# Patient Record
Sex: Female | Born: 1980
Health system: Southern US, Community
[De-identification: ages and names within clinical notes are randomized; demographics above are authoritative.]

## PROBLEM LIST (undated history)

## (undated) DIAGNOSIS — F32A Depression, unspecified: Secondary | ICD-10-CM

## (undated) DIAGNOSIS — B977 Papillomavirus as the cause of diseases classified elsewhere: Secondary | ICD-10-CM

## (undated) DIAGNOSIS — E041 Nontoxic single thyroid nodule: Secondary | ICD-10-CM

## (undated) DIAGNOSIS — A64 Unspecified sexually transmitted disease: Secondary | ICD-10-CM

## (undated) DIAGNOSIS — F419 Anxiety disorder, unspecified: Secondary | ICD-10-CM

## (undated) HISTORY — DX: Depression, unspecified: F32.A

## (undated) HISTORY — DX: Unspecified sexually transmitted disease: A64

## (undated) HISTORY — PX: OTHER SURGICAL HISTORY: SHX169

## (undated) HISTORY — DX: Papillomavirus as the cause of diseases classified elsewhere: B97.7

## (undated) HISTORY — DX: Anxiety disorder, unspecified: F41.9

---

## 2004-08-12 DIAGNOSIS — B977 Papillomavirus as the cause of diseases classified elsewhere: Secondary | ICD-10-CM

## 2004-08-12 HISTORY — DX: Papillomavirus as the cause of diseases classified elsewhere: B97.7

## 2005-08-12 HISTORY — PX: WISDOM TOOTH EXTRACTION: SHX21

## 2008-08-12 DIAGNOSIS — A64 Unspecified sexually transmitted disease: Secondary | ICD-10-CM

## 2008-08-12 HISTORY — DX: Unspecified sexually transmitted disease: A64

## 2012-04-07 LAB — HM PAP SMEAR

## 2012-05-13 ENCOUNTER — Ambulatory Visit (INDEPENDENT_AMBULATORY_CARE_PROVIDER_SITE_OTHER): Payer: PRIVATE HEALTH INSURANCE | Admitting: Internal Medicine

## 2012-05-13 ENCOUNTER — Encounter: Payer: Self-pay | Admitting: Internal Medicine

## 2012-05-13 VITALS — BP 94/68 | HR 71 | Temp 99.3°F | Ht 60.0 in | Wt 156.0 lb

## 2012-05-13 DIAGNOSIS — Z0000 Encounter for general adult medical examination without abnormal findings: Secondary | ICD-10-CM | POA: Insufficient documentation

## 2012-05-13 DIAGNOSIS — J069 Acute upper respiratory infection, unspecified: Secondary | ICD-10-CM | POA: Insufficient documentation

## 2012-05-13 NOTE — Assessment & Plan Note (Signed)
31 year old female with signs and symptoms of viral upper respiratory infection. She has chronic persistent cough. I suggest she use intranasal saline continue over-the-counter Mucinex. Patient advised to contact our office if she has  worsening symptoms.

## 2012-05-13 NOTE — Patient Instructions (Addendum)
Use intranasal saline spray 3-4 times per day Gargle with warm salt water

## 2012-05-13 NOTE — Progress Notes (Signed)
Subjective:    Patient ID: Robin Burke, female    DOB: Mar 10, 1981, 31 y.o.   MRN: [id number]  HPI  31 year old female to establish. Patient complains of nonproductive cough for 2 weeks. Her symptoms initially started with mild sore throat and then progressed to cough. Patient reports cough is worse in the morning. She has tried several herbal remedies without any improvement. She denies any fever or chills. She denies any history of asthma. She has remote history of mild tobacco use.  Approximately one week ago she was seen by nurse practitioner at her employer. She has been using Mucinex antihistamines with mild improvement. She denies any sinus pressure or pain.  Review of Systems   Constitutional: Negative for activity change, appetite change and unexpected weight change.  Eyes: Negative for visual disturbance.  Respiratory: Negative chest tightness and shortness of breath.   Cardiovascular: Negative for chest pain.  Genitourinary: Negative for difficulty urinating.  Neurological: Negative for headaches.  Gastrointestinal: Negative for abdominal pain, heartburn melena or hematochezia Psych: Negative for depression or anxiety GYN:  She is followed by local gynecologist for Pap and pelvic exam  History reviewed. No pertinent past medical history.  History   Social History  . Marital Status: Single    Spouse Name: N/A    Number of Children: N/A  . Years of Education: N/A   Occupational History  . 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 Smoking status: Not on file  . Smokeless tobacco: Not on file  . Alcohol Use: 1.8 oz/week    3 Glasses of wine per week  . Drug Use:   . Sexually Active:    Other Topics Concern  . Not on file   Social History Narrative   She moved to [place] one half years ago. She previously lived in [place] and in [place].She is half [place] and half [place].She works for [employer]    History reviewed. No  pertinent past surgical history.  Family History  Problem Relation Age of Onset  . Diabetes Mother   . Hyperlipidemia Mother   . Coronary artery disease Father     Had 1 stent placed  . Thyroid cancer Maternal Grandmother   . Osteoarthritis Maternal Grandmother     No Known Allergies  Current Outpatient Prescriptions on File Prior to Visit  Medication Sig Dispense Refill  . fexofenadine (ALLEGRA) 180 MG tablet Take 180 mg by mouth daily.        BP 94/68  Pulse 71  Temp 99.3 F (37.4 C) (Oral)  Ht 5' (1.524 m)  Wt 156 lb (70.761 kg)  BMI 30.47 kg/m2  SpO2 99%  LMP 04/15/2012           Objective:   Physical Exam  Constitutional: She is oriented to person, place, and time. She appears well-developed and well-nourished. No distress.  HENT:  Head: Normocephalic and atraumatic.  Right Ear: External ear normal.  Left Ear: External ear normal.  Mouth/Throat: No oropharyngeal exudate.       Signs of postnasal drip  Eyes: EOM are normal. Pupils are equal, round, and reactive to light.  Neck: Neck supple.       No carotid bruit  Cardiovascular: Normal rate, regular rhythm, normal heart sounds and intact distal pulses.   No murmur heard. Pulmonary/Chest: Effort normal and breath sounds normal. She has no wheezes. She has no rales.  Abdominal: Bowel sounds are normal. She exhibits no mass. There  is no tenderness.  Musculoskeletal: She exhibits no edema.  Lymphadenopathy:    She has no cervical adenopathy.  Neurological: She is alert and oriented to person, place, and time. No cranial nerve deficit. She exhibits normal muscle tone. Coordination normal.  Skin: Skin is warm and dry.  Psychiatric: She has a normal mood and affect. Her behavior is normal.          Assessment & Plan:

## 2012-05-13 NOTE — Assessment & Plan Note (Signed)
Patient reports she received her tetanus vaccine in 2007 - 2008. She is followed by local gynecologist for annual Pap and pelvic exams. Patient will check status of her hepatitis A vaccine as she frequently travels internationally.

## 2013-04-30 ENCOUNTER — Encounter: Payer: Self-pay | Admitting: Nurse Practitioner

## 2013-05-03 ENCOUNTER — Ambulatory Visit: Payer: Self-pay | Admitting: Nurse Practitioner

## 2013-05-04 ENCOUNTER — Ambulatory Visit (INDEPENDENT_AMBULATORY_CARE_PROVIDER_SITE_OTHER): Payer: PRIVATE HEALTH INSURANCE | Admitting: Nurse Practitioner

## 2013-05-04 ENCOUNTER — Encounter: Payer: Self-pay | Admitting: Nurse Practitioner

## 2013-05-04 VITALS — BP 118/74 | HR 64 | Resp 16 | Ht 61.0 in | Wt 163.0 lb

## 2013-05-04 DIAGNOSIS — Z01419 Encounter for gynecological examination (general) (routine) without abnormal findings: Secondary | ICD-10-CM

## 2013-05-04 DIAGNOSIS — Z0000 Encounter for general adult medical examination without abnormal findings: Secondary | ICD-10-CM

## 2013-05-04 LAB — HEMOGLOBIN, FINGERSTICK: Hemoglobin, fingerstick: 14.1 g/dL (ref 12.0–16.0)

## 2013-05-04 NOTE — Patient Instructions (Signed)

## 2013-05-04 NOTE — Progress Notes (Signed)
Patient ID: Robin Burke, female   DOB: 04-11-1981, 32 y.o.   MRN: [id number] 32 y.o. G0P0 Married mixed racial Fe here for annual exam.  Menses is regular and last 5 days. Not planning pregnancy this year. Chronic constipation has now cleared. Change of diet and increase of water has helped.  Also found out she has gluten allergy.  Husband lives in the [place] and his Holiday representative job is expected to end next summer and then plans on moving to the states.  She gets to see him about 1-2 times yearly.  Patient's last menstrual period was 04/29/2013.          Sexually active: no  The current method of family planning is none. Condoms prn.   Exercising: yes  cardio and yoga Smoker:  no  Health Maintenance: Pap:  04/07/12, WNL TDaP:  2006  Labs: HB: 14.1 Urine: unable to void   reports that she has quit smoking. Her smoking use included Cigarettes. She smoked 0.10 packs per day. She has never used smokeless tobacco. She reports that she drinks about 1.2 ounces of alcohol per week. She reports that she does not use illicit drugs.  Past Medical History  Diagnosis Date  . STD (female)     HSV I & II  . HPV in female 2006    w/cryo, normal since    No past surgical history on file.  Current Outpatient Prescriptions  Medication Sig Dispense Refill  . Multiple Vitamin (MULTIVITAMIN) tablet Take 1 tablet by mouth daily.       No current facility-administered medications for this visit.    Family History  Problem Relation Age of Onset  . Diabetes Mother   . Hyperlipidemia Mother   . Coronary artery disease Father     Had 1 stent placed  . Thyroid cancer Maternal Grandmother   . Osteoarthritis Maternal Grandmother     ROS:  Pertinent items are noted in HPI.  Otherwise, a comprehensive ROS was negative.  Exam:   BP 118/74  Pulse 64  Resp 16  Ht 5\' 1"  (1.549 m)  Wt 163 lb (73.936 kg)  BMI 30.81 kg/m2  LMP 04/29/2013 Height: 5\' 1"  (154.9 cm)  Ht Readings from Last 3  Encounters:  05/04/13 5\' 1"  (1.549 m)  05/13/12 5' (1.524 m)    General appearance: alert, cooperative and appears stated age Head: Normocephalic, without obvious abnormality, atraumatic Neck: no adenopathy, supple, symmetrical, trachea midline and thyroid normal to inspection and palpation Lungs: clear to auscultation bilaterally Breasts: normal appearance, no masses or tenderness Heart: regular rate and rhythm Abdomen: soft, non-tender; no masses,  no organomegaly Extremities: extremities normal, atraumatic, no cyanosis or edema Skin: Skin color, texture, turgor normal. No rashes or lesions Lymph nodes: Cervical, supraclavicular, and axillary nodes normal. No abnormal inguinal nodes palpated Neurologic: Grossly normal   Pelvic: External genitalia:  no lesions              Urethra:  normal appearing urethra with no masses, tenderness or lesions              Bartholin's and Skene's: normal                 Vagina: normal appearing vagina with normal color and discharge, no lesions              Cervix: anteverted              Pap taken: yes Bimanual Exam:  Uterus:  normal size,  contour, position, consistency, mobility, non-tender              Adnexa: no mass, fullness, tenderness               Rectovaginal: Confirms               Anus:  normal sphincter tone, no lesions  A:  Well Woman with normal exam  Condoms for birth control    P:   Pap smear as per guidelines pap done today  Per request wanted blood type  Counseled on breast self exam, adequate intake of calcium and vitamin D, diet and exercise return annually or prn  An After Visit Summary was printed and given to the patient.

## 2013-05-06 LAB — IPS PAP TEST WITH HPV

## 2013-05-10 ENCOUNTER — Telehealth: Payer: Self-pay | Admitting: Nurse Practitioner

## 2013-05-10 NOTE — Telephone Encounter (Signed)
Results given via phone message:  Result Notes    Notes Recorded by Luisa Dago, CMA on 05/10/2013 at 4:11 PM Pt is going to think about coming back. She will have to ask for time off. ------  Notes Recorded by Luisa Dago, CMA on 05/10/2013 at 4:01 PM I have attempted to contact this patient by phone with the following results: left message to return my call on answering machine (home/mobile). ------  Notes Recorded by Lauro Franklin, FNP on 05/07/2013 at 11:08 AM Pap 02, did she decide to come back for blood typing?

## 2013-05-10 NOTE — Telephone Encounter (Signed)
Pt calling for results

## 2013-05-10 NOTE — Progress Notes (Signed)
Encounter reviewed by Dr. Elyshia Kumagai Silva.  

## 2013-05-27 ENCOUNTER — Telehealth: Payer: Self-pay | Admitting: Nurse Practitioner

## 2013-05-27 NOTE — Telephone Encounter (Signed)
Patient calling to reschedule labs that were missed at last visit but first wants to discuss what the cost of the lab may be with [place] in insurance.

## 2013-05-27 NOTE — Telephone Encounter (Signed)
Patient is requesting blood type testing.  Will need lab appointment if she requests.  Will wait for Shanda Bumps to call patient to discuss cost.

## 2013-05-28 ENCOUNTER — Telehealth: Payer: Self-pay | Admitting: Nurse Practitioner

## 2013-05-28 NOTE — Telephone Encounter (Signed)
Patient calling you back about her insurance covering a procedure [phone] blood type test. She wants to know how much does it cost if she didn't have insurance.

## 2013-12-31 ENCOUNTER — Ambulatory Visit (INDEPENDENT_AMBULATORY_CARE_PROVIDER_SITE_OTHER): Payer: PRIVATE HEALTH INSURANCE | Admitting: Internal Medicine

## 2013-12-31 ENCOUNTER — Encounter: Payer: Self-pay | Admitting: Internal Medicine

## 2013-12-31 VITALS — BP 94/66 | HR 80 | Temp 98.5°F | Ht 61.0 in | Wt 165.0 lb

## 2013-12-31 DIAGNOSIS — R5383 Other fatigue: Principal | ICD-10-CM | POA: Insufficient documentation

## 2013-12-31 DIAGNOSIS — R5381 Other malaise: Secondary | ICD-10-CM

## 2013-12-31 DIAGNOSIS — E041 Nontoxic single thyroid nodule: Secondary | ICD-10-CM | POA: Insufficient documentation

## 2013-12-31 DIAGNOSIS — R5382 Chronic fatigue, unspecified: Secondary | ICD-10-CM | POA: Insufficient documentation

## 2013-12-31 LAB — BASIC METABOLIC PANEL
BUN: 12 mg/dL (ref 6–23)
CO2: 26 meq/L (ref 19–32)
Calcium: 9 mg/dL (ref 8.4–10.5)
Chloride: 103 mEq/L (ref 96–112)
Creatinine, Ser: 0.6 mg/dL (ref 0.4–1.2)
GFR: 116.47 mL/min (ref >60.00)
GLUCOSE: 88 mg/dL (ref 70–99)
POTASSIUM: 4.1 meq/L (ref 3.5–5.1)
SODIUM: 136 meq/L (ref 135–145)

## 2013-12-31 LAB — CBC WITH DIFFERENTIAL/PLATELET
BASOS ABS: 0 10*3/uL (ref 0.0–0.1)
Basophils Relative: 0.4 % (ref 0.0–3.0)
EOS ABS: 0.2 10*3/uL (ref 0.0–0.7)
Eosinophils Relative: 3.1 % (ref 0.0–5.0)
HEMATOCRIT: 41.5 % (ref 36.0–46.0)
HEMOGLOBIN: 13.5 g/dL (ref 12.0–15.0)
LYMPHS ABS: 1.3 10*3/uL (ref 0.7–4.0)
Lymphocytes Relative: 26.7 % (ref 12.0–46.0)
MCHC: 32.5 g/dL (ref 30.0–36.0)
MCV: 78.5 fl (ref 78.0–100.0)
MONO ABS: 0.5 10*3/uL (ref 0.1–1.0)
Monocytes Relative: 9.7 % (ref 3.0–12.0)
NEUTROS ABS: 3 10*3/uL (ref 1.4–7.7)
Neutrophils Relative %: 60.1 % (ref 43.0–77.0)
Platelets: 199 10*3/uL (ref 150.0–400.0)
RBC: 5.29 Mil/uL — ABNORMAL HIGH (ref 3.87–5.11)
RDW: 14.2 % (ref 11.5–15.5)
WBC: 5 10*3/uL (ref 4.0–10.5)

## 2013-12-31 LAB — TSH: TSH: 1.03 u[IU]/mL (ref 0.35–4.50)

## 2013-12-31 LAB — HEPATIC FUNCTION PANEL
ALBUMIN: 4.1 g/dL (ref 3.5–5.2)
ALT: 25 U/L (ref 0–35)
AST: 21 U/L (ref 0–37)
Alkaline Phosphatase: 57 U/L (ref 39–117)
Bilirubin, Direct: 0 mg/dL (ref 0.0–0.3)
Total Bilirubin: 0.7 mg/dL (ref 0.2–1.2)
Total Protein: 6.9 g/dL (ref 6.0–8.3)

## 2013-12-31 LAB — T3, FREE: T3, Free: 2.9 pg/mL (ref 2.3–4.2)

## 2013-12-31 LAB — T4, FREE: Free T4: 0.92 ng/dL (ref 0.60–1.60)

## 2013-12-31 LAB — LIPID PANEL
CHOLESTEROL: 170 mg/dL (ref 0–200)
HDL: 49.8 mg/dL (ref >39.00)
LDL Cholesterol: 91 mg/dL (ref 0–99)
TRIGLYCERIDES: 146 mg/dL (ref 0.0–149.0)
Total CHOL/HDL Ratio: 3
VLDL: 29.2 mg/dL (ref 0.0–40.0)

## 2013-12-31 NOTE — Progress Notes (Signed)
   Subjective:    Patient ID: Robin Burke, female    DOB: September 10, 1980, 33 y.o.   MRN: [id number]  HPI  33 year old female complains of progressive fatigue over last few months. She reports gaining approximately 10 pounds and never feeling she gets enough sleep. She also has intermittent headaches.  She is worried about possible thyroid disorder. Her maternal grandmother has history of thyroid cancer.  She does admit to increased stress that is work related. 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  She often experiences poor sleep.   Review of Systems 10 pound weight gain, negative for alopecia, negative constipation, negative cold intolerance    Past Medical History  Diagnosis Date  . STD (female)     HSV I & II  . HPV in female 2006    w/cryo, normal since    History   Social History  . Marital Status: Married    Spouse Name: N/A    Number of Children: 0  . Years of Education: N/A   Occupational History  . 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 Smoking status: Former Smoker -- 0.25 packs/day for 10 years    Types: Cigarettes    Quit date: 09/13/2011  . Smokeless tobacco: Never Used  . Alcohol Use: 1.2 - 1.8 oz/week    2-3 Glasses of wine per week  . Drug Use: No  . Sexual Activity: Yes    Partners: Male   Other Topics Concern  . Not on file   Social History Narrative   She moved to [place] one half years ago. She previously lived in [place] and in [place].   She is half [place] and half [place].   She works for [employer]    Past Surgical History  Procedure Laterality Date  . Wisdom tooth extraction  2007    Family History  Problem Relation Age of Onset  . Diabetes Mother   . Hyperlipidemia Mother   . Coronary artery disease Father     Had 1 stent placed  . Thyroid cancer Maternal Grandmother   . Osteoarthritis Maternal Grandmother   . Diabetes Brother     No  Known Allergies  Current Outpatient Prescriptions on File Prior to Visit  Medication Sig Dispense Refill  . Multiple Vitamin (MULTIVITAMIN) tablet Take 1 tablet by mouth daily.       No current facility-administered medications on file prior to visit.    BP 94/66  Pulse 80  Temp(Src) 98.5 F (36.9 C) (Oral)  Ht 5\' 1"  (1.549 m)  Wt 165 lb (74.844 kg)  BMI 31.19 kg/m2    Objective:   Physical Exam  Constitutional: She is oriented to person, place, and time. She appears well-developed and well-nourished. No distress.  HENT:  Head: Normocephalic and atraumatic.  Right Ear: External ear normal.  Left Ear: External ear normal.  Mouth/Throat: Oropharynx is clear and moist.  Neck: Neck supple.  Question right thyroid nodule  Cardiovascular: Normal rate, regular rhythm and normal heart sounds.   No murmur heard. Pulmonary/Chest: Effort normal and breath sounds normal. She has no wheezes.  Musculoskeletal: She exhibits no edema.  Neurological: She is alert and oriented to person, place, and time. No cranial nerve deficit.  Psychiatric: She has a normal mood and affect. Her behavior is normal.          Assessment & Plan:

## 2013-12-31 NOTE — Assessment & Plan Note (Signed)
33 year old female complains of worsening fatigue. She has family history of thyroid cancer. Rule out metabolic disorder. Obtain screening blood work. I suspect her fatigue secondary to her stressful work situation and frequent travel. She declines use of prescription sleep aids.

## 2013-12-31 NOTE — Progress Notes (Signed)
Pre visit review using our clinic review tool, if applicable. No additional management support is needed unless otherwise documented below in the visit note. 

## 2013-12-31 NOTE — Assessment & Plan Note (Signed)
There is question of possible right thyroid nodule on exam. She has family history of thyroid cancer. Obtain thyroid ultrasound.

## 2014-01-01 LAB — HIV ANTIBODY (ROUTINE TESTING W REFLEX): HIV 1&2 Ab, 4th Generation: NONREACTIVE

## 2014-01-06 ENCOUNTER — Ambulatory Visit
Admission: RE | Admit: 2014-01-06 | Discharge: 2014-01-06 | Disposition: A | Discharge status: Home / Self Care | Payer: PRIVATE HEALTH INSURANCE | Source: Ambulatory Visit | Attending: Internal Medicine | Admitting: Internal Medicine

## 2014-01-06 DIAGNOSIS — E041 Nontoxic single thyroid nodule: Secondary | ICD-10-CM

## 2014-01-18 ENCOUNTER — Encounter: Payer: Self-pay | Admitting: *Deleted

## 2014-01-18 ENCOUNTER — Other Ambulatory Visit: Payer: Self-pay | Admitting: Internal Medicine

## 2014-01-18 DIAGNOSIS — E041 Nontoxic single thyroid nodule: Secondary | ICD-10-CM

## 2014-01-25 ENCOUNTER — Ambulatory Visit
Admission: RE | Admit: 2014-01-25 | Discharge: 2014-01-25 | Disposition: A | Discharge status: Home / Self Care | Payer: PRIVATE HEALTH INSURANCE | Source: Ambulatory Visit | Attending: Internal Medicine | Admitting: Internal Medicine

## 2014-01-25 ENCOUNTER — Other Ambulatory Visit (HOSPITAL_COMMUNITY)
Admission: RE | Admit: 2014-01-25 | Discharge: 2014-01-25 | Disposition: A | Discharge status: Home / Self Care | Payer: PRIVATE HEALTH INSURANCE | Source: Ambulatory Visit | Attending: Interventional Radiology | Admitting: Interventional Radiology

## 2014-01-25 DIAGNOSIS — E041 Nontoxic single thyroid nodule: Secondary | ICD-10-CM | POA: Insufficient documentation

## 2014-01-28 ENCOUNTER — Encounter: Payer: Self-pay | Admitting: Internal Medicine

## 2014-01-28 ENCOUNTER — Ambulatory Visit (INDEPENDENT_AMBULATORY_CARE_PROVIDER_SITE_OTHER): Payer: PRIVATE HEALTH INSURANCE | Admitting: Internal Medicine

## 2014-01-28 ENCOUNTER — Other Ambulatory Visit: Payer: Self-pay | Admitting: Internal Medicine

## 2014-01-28 VITALS — BP 102/60 | HR 65 | Temp 98.3°F | Resp 12 | Wt 162.8 lb

## 2014-01-28 DIAGNOSIS — R5383 Other fatigue: Secondary | ICD-10-CM

## 2014-01-28 DIAGNOSIS — E041 Nontoxic single thyroid nodule: Secondary | ICD-10-CM

## 2014-01-28 DIAGNOSIS — R5381 Other malaise: Secondary | ICD-10-CM

## 2014-01-28 NOTE — Progress Notes (Signed)
Patient ID: Robin Burke, female   DOB: [birth year], 33 y.o.   MRN: [id number]   HPI  Robin Burke is a 33 y.o.-year-old female, referred by her PCP, Robin Burke, for management of a R Thyroid nodule.  Thyroid U/S (01/18/2014):   Right thyroid lobe : 3.8 x 1.9 x 1.8 cm. There is a complex nodule in the right thyroid lobe that measures 2.1 x 1.2 x 1.6 cm. This nodule is in the upper aspect of the right thyroid lobe. No definite calcifications in this nodule.   Left thyroid lobe : 3.8 x 1.3 x 1.8 cm. Small hypoechoic nodule in the inferior left thyroid lobe measures 0.6 x 0.5 x 0.6 cm. This nodule is mildly heterogeneous. There is a second small heterogeneous nodule in the inferior left thyroid lobe that measures 0.7 x 0.4 x 0.6 cm.   Isthmus Thickness: 0.5 cm. No nodules visualized.   Lymphadenopathy None visualized.  FNA of the R dominant nodule (01/25/2014): FLUS (follicular lesion of undetermined significance)  I reviewed pt's thyroid tests and they were normal: Lab Results  Component Value Date   TSH 1.03 12/31/2013   FREET4 0.92 12/31/2013    Pt denies feeling nodules in neck, hoarseness, dysphagia/odynophagia, SOB with lying down.  Pt c/o: - + exhausted, + fatigue - + poor sleep - + weight gain - used to have heat intolerance/now cold intolerance - no tremors - + palpitations lately - anxiety/depression -+ constipation - no dry skin - + hair falling - + HAs - + dysmenorrhea - lately; menses are monthly   Pt does have a FH of thyroid ds in mother (mother with MNG). + FH of thyroid cancer in MGM. No h/o radiation tx to head or neck.  She loves to eat seaweed (not recently). No recent CT scans. No steroid use. No herbal supplements.   She travels to [place] and [place] 2x a year (for work).  ROS: Constitutional: see HPI Eyes: no blurry vision, no xerophthalmia ENT: + sore throat, no nodules palpated in throat, no dysphagia/odynophagia, +  Hoarseness, +  tinnistus Cardiovascular: + CP/+ SOB/+ palpitations/+ leg swelling Respiratory: no cough/+ SOB Gastrointestinal: no N/V/D/+ C Musculoskeletal: + muscle aches, no joint aches Skin: no rashes, + hair loss Neurological: no tremors/numbness/tingling/dizziness, + HA Psychiatric: no depression/anxiety + Low libido  Past Medical History  Diagnosis Date  . STD (female)     HSV I & II  . HPV in female 2006    w/cryo, normal since   Past Surgical History  Procedure Laterality Date  . Wisdom tooth extraction  2007   History   Social History  . Marital Status: Married    Spouse Name: N/A    Number of Children: 0  . Years of Education: N/A   Occupational History  . 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 Smoking status: Former Smoker -- 0.25 packs/day for 10 years    Types: Cigarettes    Quit date: 09/13/2011  . Smokeless tobacco: Never Used  . Alcohol Use: 1.2 - 1.8 oz/week    2-3 Glasses of wine per week  . Drug Use: No  . Sexual Activity: Yes    Partners: Male   Social History Narrative   She moved to [place] one half years ago. She previously lived in [place] and in [place].   She is half [place] and half [place].   She works for [employer]   Current Outpatient Prescriptions on  File Prior to Visit  Medication Sig Dispense Refill  . Multiple Vitamin (MULTIVITAMIN) tablet Take 1 tablet by mouth daily.       No current facility-administered medications on file prior to visit.   No Known Allergies Family History  Problem Relation Age of Onset  . Diabetes Mother   . Hyperlipidemia Mother   . Coronary artery disease Father     Had 1 stent placed  . Thyroid cancer Maternal Grandmother   . Osteoarthritis Maternal Grandmother   . Diabetes Brother    PE: BP 102/60  Pulse 65  Temp(Src) 98.3 F (36.8 C) (Oral)  Resp 12  Wt 162 lb 12.8 oz (73.846 kg)  SpO2 98% Wt Readings from Last 3 Encounters:  01/28/14 162 lb 12.8 oz (73.846  kg)  12/31/13 165 lb (74.844 kg)  05/04/13 163 lb (73.936 kg)   Constitutional: overweight, in NAD Eyes: PERRLA, EOMI, no exophthalmos ENT: moist mucous membranes, + thyromegaly R>L, no cervical lymphadenopathy Cardiovascular: RRR, No MRG Respiratory: CTA B Gastrointestinal: abdomen soft, NT, ND, BS+ Musculoskeletal: no deformities, strength intact in all 4;  Skin: moist, warm, no rashes Neurological: no tremor with outstretched hands, DTR normal in all 4  ASSESSMENT: 1. MNG - dominant nodule Bx >> FLUS  2. Fatigue  PLAN: 1. MNG  - I reviewed the images of her thyroid ultrasound along with the patient. I pointed out that the dominant nodule is large, this being a risk factor for cancer. Pt does have a thyroid cancer family history and this increases her risk of cancer. She does not have a personal history of RxTx to head/neck.  - we discussed about the FLUS (Bethesda class 3) meaning and I explained that this is a gray area for a Bx: not clear if it is benign or malignant. The risk of malignancy is actually ~5-15% for this particular lesion. - since there is a higher risk of cancer, the guidelines recommend reBx in 6-12 mo. I also offer to the pt the option of referring her to surgery now for hemithyroidectomy. I did explain that, while thyroid surgery is not a complicated one, it still can have side effects and also she might have a risk of ~25% of becoming hypothyroid after hemithyroidectomy. She opted for Re-Bx in 6 mo. I advised her to call me in December so I can order it - if this is not cancer, we can continue to follow her on a yearly basis, and check another ultrasound in another year or 2. - I'll see her back in a year, assuming her FNA is normal. If FNA abnormal, we will meet sooner.  - I advised pt to join my chart and I will send her the results through there   2. Fatigue - pt requests more in depth thyroid testing due to her family history of thyroid ds and her fatigue  -  since her TSH, free T4 and free T3 were recently normal, I ordered anti-Tg and anti-TPO Abs to check for Hashimoto's thyroiditis  Component     Latest Ref Rng 01/28/2014  Thyroid Peroxidase Antibody     <35.0 IU/mL <10.0  Thyroglobulin Ab     <40.0 IU/mL <20.0  No sign of Hashimoto's thyroiditis.

## 2014-01-28 NOTE — Patient Instructions (Signed)
Please return in 1 year. Please call me in 6 months to reorder the thyroid biopsy.

## 2014-01-31 ENCOUNTER — Encounter: Payer: Self-pay | Admitting: *Deleted

## 2014-01-31 LAB — THYROGLOBULIN ANTIBODY: Thyroglobulin Ab: <20 IU/mL (ref <40.0)

## 2014-01-31 LAB — THYROID PEROXIDASE ANTIBODY

## 2014-03-02 ENCOUNTER — Ambulatory Visit: Payer: PRIVATE HEALTH INSURANCE | Admitting: Psychology

## 2014-05-03 ENCOUNTER — Telehealth: Payer: Self-pay | Admitting: Nurse Practitioner

## 2014-05-03 NOTE — Telephone Encounter (Signed)
Called patient and left message cancelling her AEX with PBerneice Gandy for 05/05/14. Patient will need to reschedule.

## 2014-05-05 ENCOUNTER — Ambulatory Visit: Payer: PRIVATE HEALTH INSURANCE | Admitting: Nurse Practitioner

## 2014-06-07 ENCOUNTER — Ambulatory Visit (INDEPENDENT_AMBULATORY_CARE_PROVIDER_SITE_OTHER): Payer: Commercial Managed Care - PPO | Admitting: Nurse Practitioner

## 2014-06-07 ENCOUNTER — Encounter: Payer: Self-pay | Admitting: Nurse Practitioner

## 2014-06-07 VITALS — BP 112/62 | HR 68 | Ht 61.0 in | Wt 157.0 lb

## 2014-06-07 DIAGNOSIS — Z0000 Encounter for general adult medical examination without abnormal findings: Secondary | ICD-10-CM | POA: Diagnosis not present

## 2014-06-07 DIAGNOSIS — R87810 Cervical high risk human papillomavirus (HPV) DNA test positive: Secondary | ICD-10-CM

## 2014-06-07 DIAGNOSIS — Z01419 Encounter for gynecological examination (general) (routine) without abnormal findings: Secondary | ICD-10-CM | POA: Diagnosis not present

## 2014-06-07 LAB — HEMOGLOBIN, FINGERSTICK: HEMOGLOBIN, FINGERSTICK: 14 g/dL (ref 12.0–16.0)

## 2014-06-07 NOTE — Progress Notes (Addendum)
Patient ID: Robin Burke, female   DOB: 01-29-1981, 33 y.o.   MRN: [id number] [age] y.o. G0P0 Married mixed race( half [place] and half [place]) Fe here for annual exam.  Menses is regular 28-30 days.  Husband lived out of the country until his return 10/21.  After SA on that day and the 24 th she had spotting for 1 1/2 day.  She thinks she was at midcycle.  LMP was about 10/5.   No complaints of pelvic pain other than mild cramps the next day.  No complaints of dryness.   No method of birth control and does not want anything.  Prior to this last SA was in the spring.  She is also being followed by PCP and referred to Dr. Elvera LennoxGherghe for right thyroid nodule.  The needle aspiration of the thyroid shows follicular lesions and neoplasm can not be ruled out.  She has decide to get another surgical opinion and plans to repeat a biopsy in December.  FMH: MGM with thyroid cancer.  Patient's last menstrual period was 05/16/2014.          Sexually active: no  The current method of family planning is none.  Exercising: yes cardio and yoga  Smoker: no   Health Maintenance:  Pap: 05/04/13, WNL  TDaP: 2006  Labs:  HB:  14.0  Urine:  Unable to void   reports that she quit smoking about 2 years ago. Her smoking use included Cigarettes. She has a 2.5 pack-year smoking history. She has never used smokeless tobacco. She reports that she drinks about 1.2 - 1.8 ounces of alcohol per week. She reports that she does not use illicit drugs.  Past Medical History  Diagnosis Date  . HPV in female 2006    w/cryo, normal since  . STD (female) 2010    HSV I & II  by serology    Past Surgical History  Procedure Laterality Date  . Wisdom tooth extraction  2007    Current Outpatient Prescriptions  Medication Sig Dispense Refill  . Multiple Vitamin (MULTIVITAMIN) tablet Take 1 tablet by mouth daily.       No current facility-administered medications for this visit.    Family History  Problem Relation Age of Onset   . Diabetes Mother   . Hyperlipidemia Mother   . Coronary artery disease Father     Had 1 stent placed  . Thyroid cancer Maternal Grandmother   . Osteoarthritis Maternal Grandmother   . Diabetes Brother     ROS:  Pertinent items are noted in HPI.  Otherwise, a comprehensive ROS was negative.  Exam:   BP 112/62  Pulse 68  Ht 5\' 1"  (1.549 m)  Wt 157 lb (71.215 kg)  BMI 29.68 kg/m2  LMP 05/16/2014 Height: 5\' 1"  (154.9 cm)  Ht Readings from Last 3 Encounters:  06/07/14 5\' 1"  (1.549 m)  12/31/13 5\' 1"  (1.549 m)  05/04/13 5\' 1"  (1.549 m)    General appearance: alert, cooperative and appears stated age Head: Normocephalic, without obvious abnormality, atraumatic Neck: no adenopathy, supple, symmetrical, trachea midline and thyroid nodular Lungs: clear to auscultation bilaterally Breasts: normal appearance, no masses or tenderness Heart: regular rate and rhythm Abdomen: soft, non-tender; no masses,  no organomegaly Extremities: extremities normal, atraumatic, no cyanosis or edema Skin: Skin color, texture, turgor normal. No rashes or lesions Lymph nodes: Cervical, supraclavicular, and axillary nodes normal. No abnormal inguinal nodes palpated Neurologic: Grossly normal   Pelvic: External genitalia:  no lesions  Urethra:  normal appearing urethra with no masses, tenderness or lesions              Bartholin's and Skene's: normal                 Vagina: normal appearing vagina with normal color and discharge, no lesions              Cervix: anteverted              Pap taken: Yes.   Bimanual Exam:  Uterus:  normal size, contour, position, consistency, mobility, non-tender              Adnexa: no mass, fullness, tenderness               Rectovaginal: Confirms               Anus:  normal sphincter tone, no lesions  A:  Well Woman with normal exam  Condoms for birth control sometimes  Recent vaginal bleeding post coital  History of bilateral thyroid nodules with a  suspicious thyroid needle biopsy and needs to rule out neoplasm.  Remote history of abnormal pap 2006 with + HR HPV  History of HSV I / II 2010 no occurences    P:   Reviewed health and wellness pertinent to exam  Pap smear taken today  Encouraged to follow with PCP and surgeon  If no menses by the next 2 weeks she will get UPT  She declines serum HCG and order is removed  Counseled on breast self exam, adequate intake of calcium and vitamin D, diet and exercise return annually or prn  An After Visit Summary was printed and given to the patient.

## 2014-06-07 NOTE — Patient Instructions (Signed)

## 2014-06-09 LAB — IPS PAP TEST WITH HPV

## 2014-06-09 NOTE — Addendum Note (Signed)
Addended by: Francee PiccoloPHILLIPS, STEPHANIE C on: 06/09/2014 10:34 AM   Modules accepted: Orders

## 2014-06-09 NOTE — Addendum Note (Signed)
Addended by: Roanna BanningGRUBB, Yash Cacciola R on: 06/09/2014 02:46 PM   Modules accepted: Orders

## 2014-06-09 NOTE — Addendum Note (Signed)
Addended by: Ria CommentGRUBB, Cipriano Millikan R on: 06/09/2014 03:10 PM   Modules accepted: Orders

## 2014-06-09 NOTE — Progress Notes (Signed)
Encounter reviewed by Dr. Kenneith Stief Silva.  

## 2014-06-15 LAB — IPS HPV GENOTYPING 16/18

## 2014-10-27 ENCOUNTER — Ambulatory Visit (INDEPENDENT_AMBULATORY_CARE_PROVIDER_SITE_OTHER): Payer: Commercial Managed Care - PPO | Admitting: Family Medicine

## 2014-10-27 ENCOUNTER — Encounter: Payer: Self-pay | Admitting: Family Medicine

## 2014-10-27 VITALS — BP 102/80 | HR 82 | Temp 99.3°F | Ht 61.0 in | Wt 174.2 lb

## 2014-10-27 DIAGNOSIS — J069 Acute upper respiratory infection, unspecified: Secondary | ICD-10-CM

## 2014-10-27 NOTE — Patient Instructions (Signed)
INSTRUCTIONS FOR UPPER RESPIRATORY INFECTION:  -plenty of rest and fluids  -nasal saline wash 2-3 times daily (use prepackaged nasal saline or bottled/distilled water if making your own)   -clean nose with nasal saline before using the nasal steroid or sinex  -can use AFRIN nasal spray for drainage and nasal congestion - but do NOT use longer then 3-4 days  -can use tylenol or ibuprofen as directed for aches and sorethroat  -in the winter time, using a humidifier at night is helpful (please follow cleaning instructions)  -if you are taking a cough medication - use only as directed, may also try a teaspoon of honey to coat the throat and throat lozenges  -for sore throat, salt water gargles can help  -follow up if you have fevers, facial pain, tooth pain, difficulty breathing or are worsening or not getting better in 5-7 days  

## 2014-10-27 NOTE — Progress Notes (Signed)
Pre visit review using our clinic review tool, if applicable. No additional management support is needed unless otherwise documented below in the visit note. 

## 2014-10-27 NOTE — Progress Notes (Signed)
HPI:  URI: -started: yesterday -symptoms:nasal congestion, sore throat, cough, PND, sub fever yesterday -denies:fever, SOB, NVD, tooth pain -has tried: advil and hot toddy -sick contacts/travel/risks: denies flu exposure, tick exposure or or Ebola risks  ROS: See pertinent positives and negatives per HPI.  Past Medical History  Diagnosis Date  . HPV in female 2006    w/cryo, normal since  . STD (female) 2010    HSV I & II  by serology    Past Surgical History  Procedure Laterality Date  . Wisdom tooth extraction  2007    Family History  Problem Relation Age of Onset  . Diabetes Mother   . Hyperlipidemia Mother   . Coronary artery disease Father     Had 1 stent placed  . Thyroid cancer Maternal Grandmother   . Osteoarthritis Maternal Grandmother   . Diabetes Brother     History   Social History  . Marital Status: Married    Spouse Name: N/A  . Number of Children: 0  . Years of Education: N/A   Occupational History  . 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 Smoking status: Former Smoker -- 0.25 packs/day for 10 years    Types: Cigarettes    Quit date: 09/13/2011  . Smokeless tobacco: Never Used  . Alcohol Use: 1.2 - 1.8 oz/week    2-3 Glasses of wine per week  . Drug Use: No  . Sexual Activity:    Partners: Male   Other Topics Concern  . None   Social History Narrative   She moved to [place] one half years ago. She previously lived in [place] and in [place].   She is half [place] and half [place].   She works for [employer]    No current outpatient prescriptions on file.  EXAM:  Filed Vitals:   10/27/14 1619  BP: 102/80  Pulse: 82  Temp: 99.3 F (37.4 C)    Body mass index is 32.93 kg/(m^2).  GENERAL: vitals reviewed and listed above, alert, oriented, appears well hydrated and in no acute distress  HEENT: atraumatic, conjunttiva clear, no obvious abnormalities on inspection of external nose and  ears, normal appearance of ear canals and TMs, clear nasal congestion, mild post oropharyngeal erythema with PND, no tonsillar edema or exudate, no sinus TTP  NECK: no obvious masses on inspection  LUNGS: clear to auscultation bilaterally, no wheezes, rales or rhonchi, good air movement  CV: HRRR, no peripheral edema  MS: moves all extremities without noticeable abnormality  PSYCH: pleasant and cooperative, no obvious depression or anxiety  ASSESSMENT AND PLAN:  Discussed the following assessment and plan:  Acute upper respiratory infection  -given HPI and exam findings today, a serious infection or illness is unlikely. We discussed potential etiologies, with VURI being most likely, and advised supportive care and monitoring. We discussed treatment side effects, likely course, antibiotic misuse, transmission, and signs of developing a serious illness. -of course, we advised to return or notify a doctor immediately if symptoms worsen or persist or new concerns arise.  NOTE: she may call for a cough medication   Patient Instructions  INSTRUCTIONS FOR UPPER RESPIRATORY INFECTION:  -plenty of rest and fluids  -nasal saline wash 2-3 times daily (use prepackaged nasal saline or bottled/distilled water if making your own)   -clean nose with nasal saline before using the nasal steroid or sinex  -can use AFRIN nasal spray for drainage and nasal congestion -  but do NOT use longer then 3-4 days  -can use tylenol or ibuprofen as directed for aches and sorethroat  -in the winter time, using a humidifier at night is helpful (please follow cleaning instructions)  -if you are taking a cough medication - use only as directed, may also try a teaspoon of honey to coat the throat and throat lozenges  -for sore throat, salt water gargles can help  -follow up if you have fevers, facial pain, tooth pain, difficulty breathing or are worsening or not getting better in 5-7 days      KIM,  Dahlia ClientHANNAH R.

## 2014-12-13 IMAGING — US US THYROID BIOPSY
1 series · 14 of 14 positions shown · non-contrast
Comparison: none

CLINICAL DATA: Dominant complex right thyroid nodule

EXAM:
ULTRASOUND-GUIDED THYROID ASPIRATION BIOPSY
TECHNIQUE: The procedure, risks (including but not limited to bleeding,
infection, organ damage ), benefits, and alternatives were explained
to the patient. Questions regarding the procedure were encouraged
and answered. The patient understands and consents to the procedure.

[Series 1: us thyroid biopsy · 0.06mm/px · 14 acquisitions, 14 frames shown]
[im 1/14]
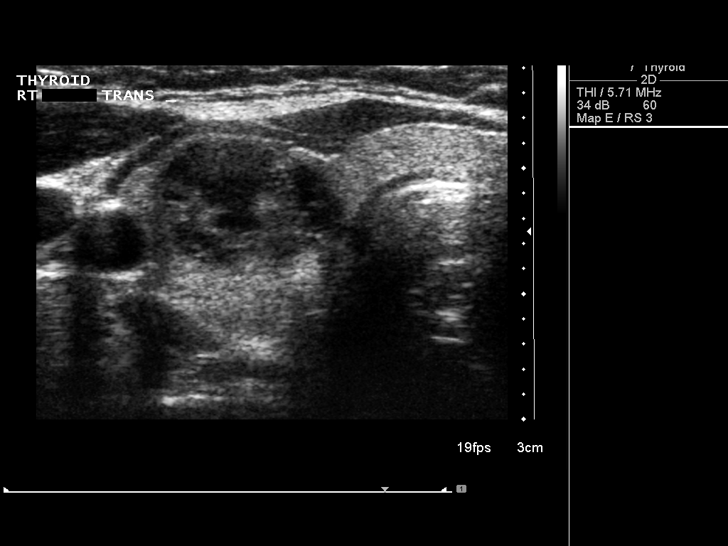
[im 2/14]
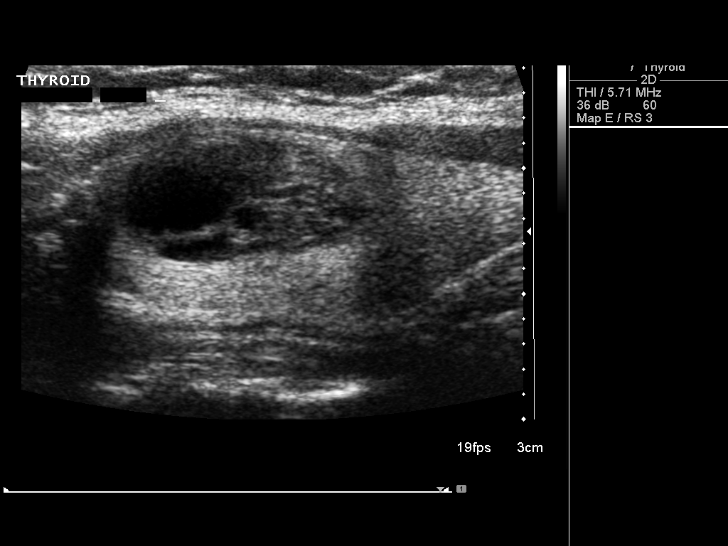
[im 3/14]
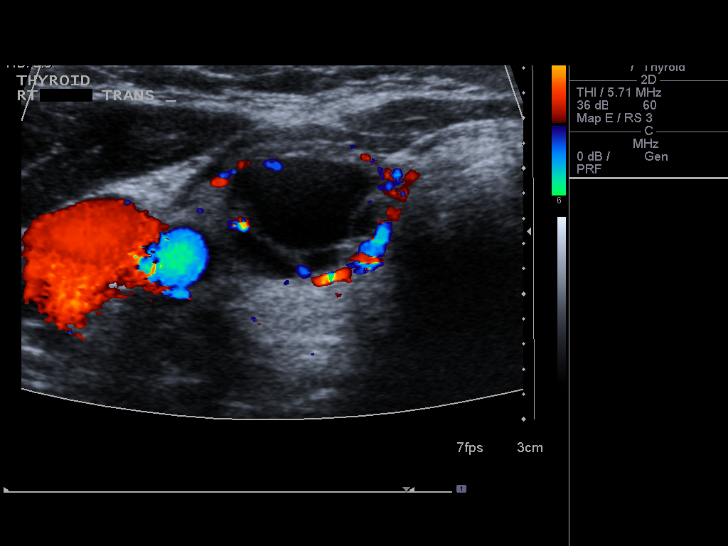
[im 4/14]
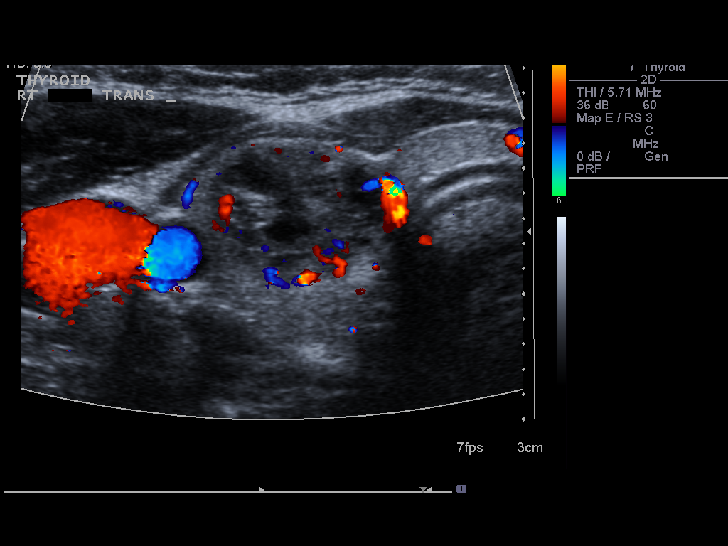
[im 5/14]
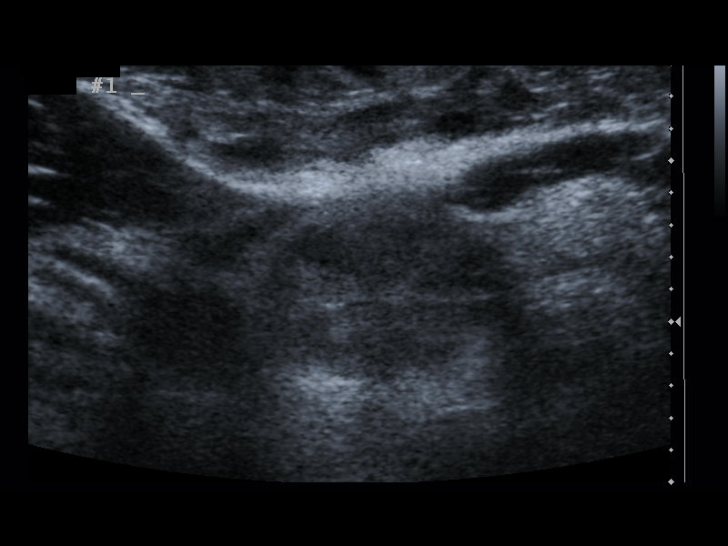
[im 6/14]
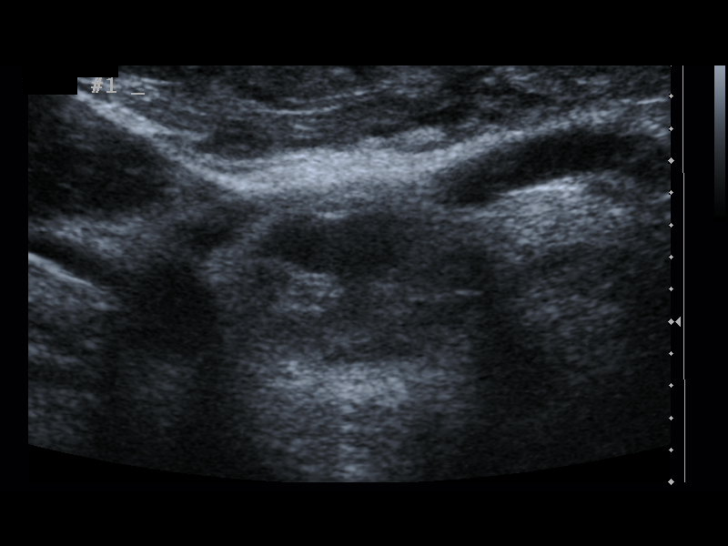
[im 7/14]
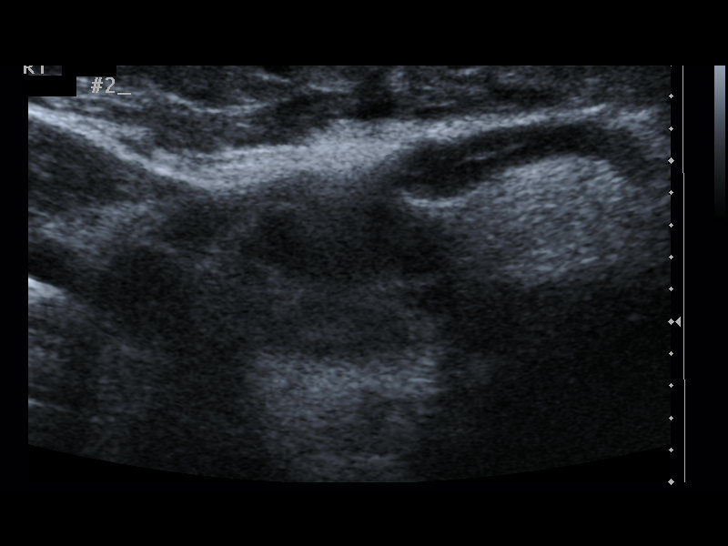
[im 8/14]
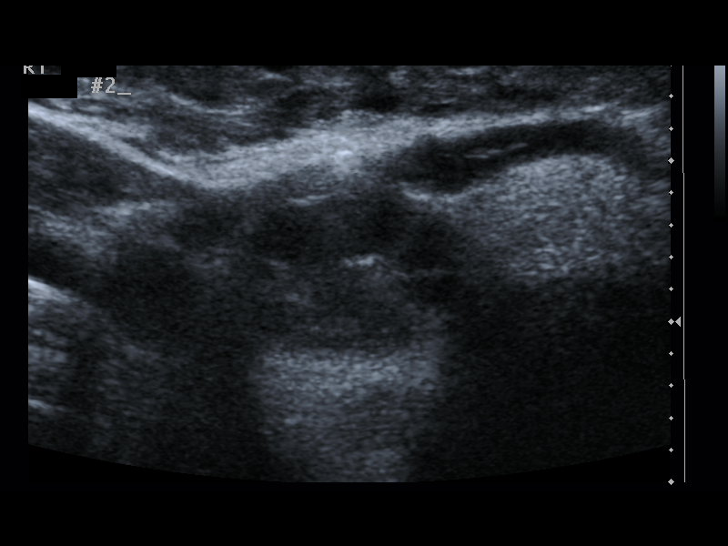
[im 9/14]
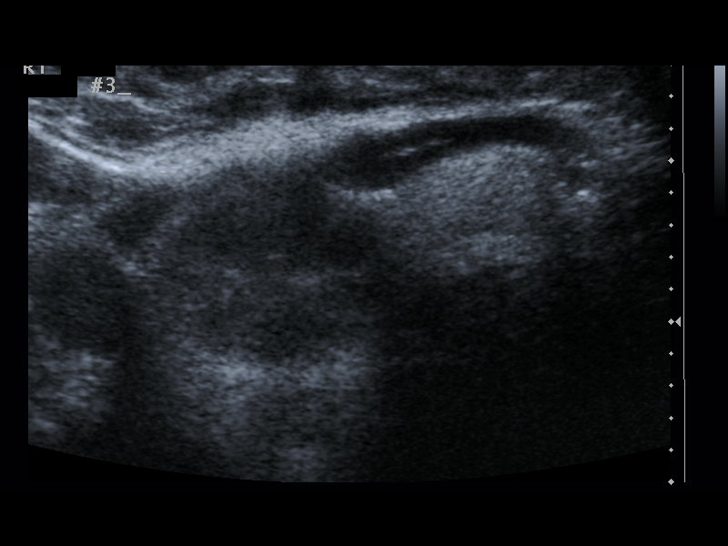
[im 10/14]
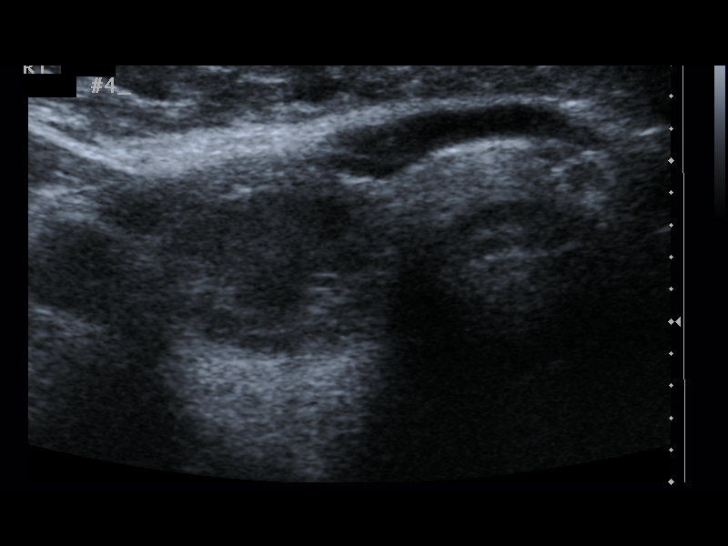
[im 11/14]
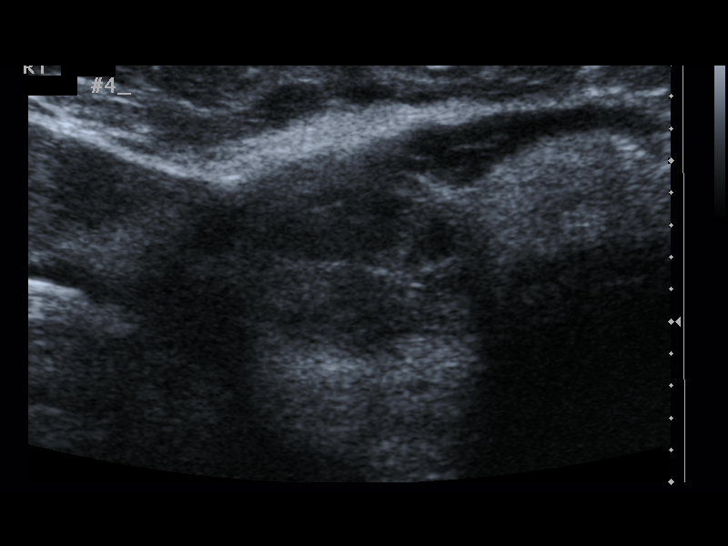
[im 12/14]
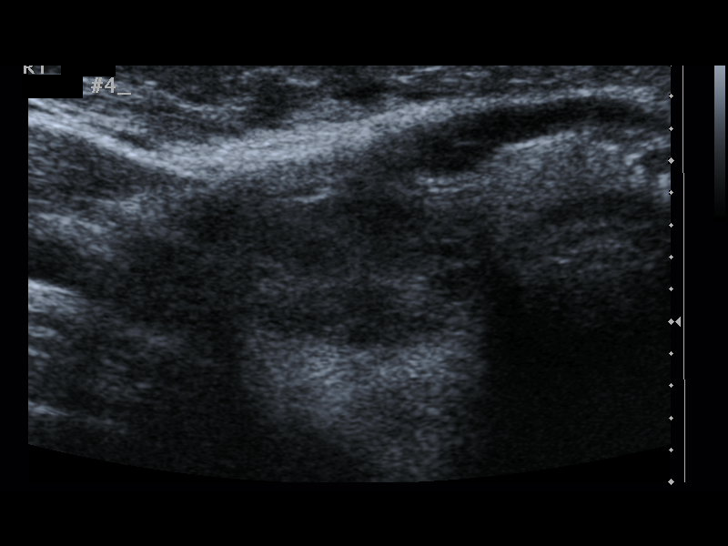
[im 13/14]
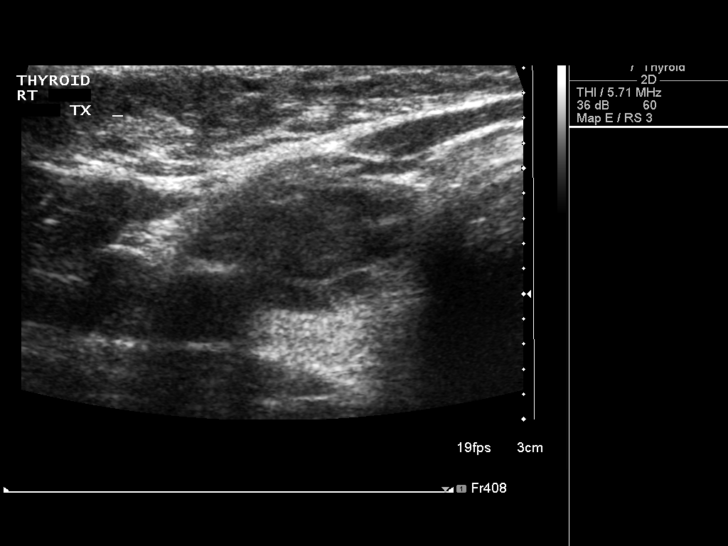
[im 14/14]
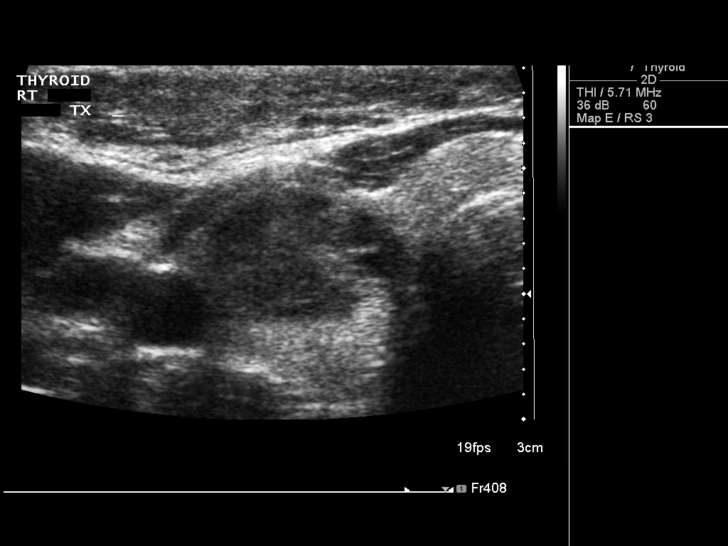

[14 of 14 positions shown; findings below may reference images not displayed]

Survey ultrasound was performed and the dominant lesion in the mid
right lobe was localized. An appropriate skin entry site was
determined. Skin was marked, then prepped with Betadine, draped in
usual sterile fashion, and infiltrated locally with 1% lidocaine.
Under real-time ultrasound guidance, 4 passes were made into the
lesion with 25 gauge needles. The patient tolerated procedure well,
with no immediate complications.
IMPRESSION: 1. Technically successful ultrasound-guided thyroid aspiration
biopsy , dominant complex mid right nodule.

## 2015-06-13 ENCOUNTER — Encounter: Payer: Self-pay | Admitting: Nurse Practitioner

## 2015-06-13 ENCOUNTER — Ambulatory Visit (INDEPENDENT_AMBULATORY_CARE_PROVIDER_SITE_OTHER): Payer: Commercial Managed Care - PPO | Admitting: Nurse Practitioner

## 2015-06-13 VITALS — BP 104/60 | HR 64 | Resp 16 | Ht 61.25 in | Wt 167.0 lb

## 2015-06-13 DIAGNOSIS — Z0000 Encounter for general adult medical examination without abnormal findings: Secondary | ICD-10-CM | POA: Diagnosis not present

## 2015-06-13 DIAGNOSIS — Z01419 Encounter for gynecological examination (general) (routine) without abnormal findings: Secondary | ICD-10-CM | POA: Diagnosis not present

## 2015-06-13 DIAGNOSIS — E041 Nontoxic single thyroid nodule: Secondary | ICD-10-CM | POA: Diagnosis not present

## 2015-06-13 LAB — THYROID PANEL WITH TSH
Free Thyroxine Index: 2.4 (ref 1.4–3.8)
T3 UPTAKE: 30 % (ref 22–35)
T4 TOTAL: 8.1 ug/dL (ref 4.5–12.0)
TSH: 1.885 u[IU]/mL (ref 0.350–4.500)

## 2015-06-13 NOTE — Progress Notes (Signed)
34 y.o. G0P0 Married 1/2 [place] and 1/2 [place] Fe here for annual exam.  Normal menses at 4 days. Some cramps and PMS.  Not planning family this year. Continues to use condoms and withdrawal for birth control.  Patient's last menstrual period was 05/29/2015.          Sexually active: Yes.    The current method of family planning is condoms sometimes.    Exercising: Yes.    periodically- at least 1 x week Smoker:  Former smoker  Health Maintenance: Pap:  06/07/14 WNL/positive HR HPV, negative 16/18 TDaP:  Offered today-would like to see if had with PCP Labs: would like thyroid checked, URINE-unable to give sample   reports that she quit smoking about 3 years ago. Her smoking use included Cigarettes. She has a 2.5 pack-year smoking history. She has never used smokeless tobacco. She reports that she drinks about 1.2 - 1.8 oz of alcohol per week. She reports that she does not use illicit drugs.  Past Medical History  Diagnosis Date  . HPV in female 2006    w/cryo, normal since  . STD (female) 2010    HSV I & II  by serology    Past Surgical History  Procedure Laterality Date  . Wisdom tooth extraction  2007    Current Outpatient Prescriptions  Medication Sig Dispense Refill  . lisdexamfetamine (VYVANSE) 40 MG capsule Take 40 mg by mouth as needed.     No current facility-administered medications for this visit.    Family History  Problem Relation Age of Onset  . Diabetes Mother   . Hyperlipidemia Mother   . Coronary artery disease Father     Had 1 stent placed  . Thyroid cancer Maternal Grandmother   . Osteoarthritis Maternal Grandmother   . Diabetes Brother     ROS:  Pertinent items are noted in HPI.  Otherwise, a comprehensive ROS was negative.  Exam:   BP 104/60 mmHg  Pulse 64  Resp 16  Ht 5' 1.25" (1.556 m)  Wt 167 lb (75.751 kg)  BMI 31.29 kg/m2  LMP 05/29/2015 Height: 5' 1.25" (155.6 cm) Ht Readings from Last 3 Encounters:  06/13/15 5' 1.25" (1.556 m)   10/27/14 5\' 1"  (1.549 m)  06/07/14 5\' 1"  (1.549 m)    General appearance: alert, cooperative and appears stated age Head: Normocephalic, without obvious abnormality, atraumatic Neck: no adenopathy, supple, symmetrical, trachea midline and thyroid normal to inspection and palpation and with a nodularity on the right Lungs: clear to auscultation bilaterally Breasts: normal appearance, no masses or tenderness Heart: regular rate and rhythm Abdomen: soft, non-tender; no masses,  no organomegaly Extremities: extremities normal, atraumatic, no cyanosis or edema Skin: Skin color, texture, turgor normal. No rashes or lesions Lymph nodes: Cervical, supraclavicular, and axillary nodes normal. No abnormal inguinal nodes palpated Neurologic: Grossly normal   Pelvic: External genitalia:  no lesions              Urethra:  normal appearing urethra with no masses, tenderness or lesions              Bartholin's and Skene's: normal                 Vagina: normal appearing vagina with normal color and discharge, no lesions              Cervix: anteverted              Pap taken: Yes.   Bimanual Exam:  Uterus:  normal size, contour, position, consistency, mobility, non-tender              Adnexa: no mass, fullness, tenderness               Rectovaginal: Confirms               Anus:  normal sphincter tone, no lesions  Chaperone present: yes  A:  Well Woman with normal exam  Condoms for birth control sometimes History of bilateral thyroid nodules with a thyroid needle biopsy 2015 negative for malignancy - would like to see a different endocrinologist to follow Remote history of abnormal pap 2006 with + HR HPV History of HSV I / II 2010 no occurences   P:   Reviewed health and wellness pertinent to exam  Pap smear as above  Discussed options for birth control that would also help with dysmenorrhea - she declines but will try OTC NSAID's prior to onset  of menses for relief of pain  Will get thyroid panel and make a referral to Dr. Talmage Nap, she will need a follow up thyroid US this year  Counseled on breast self exam, adequate intake of calcium and vitamin D, diet and exercise return annually or prn  An After Visit Summary was printed and given to the patient.

## 2015-06-13 NOTE — Patient Instructions (Signed)

## 2015-06-14 LAB — HEMOGLOBIN, FINGERSTICK: Hemoglobin, fingerstick: 13.4 g/dL (ref 12.0–16.0)

## 2015-06-15 NOTE — Progress Notes (Signed)
Encounter reviewed by Dr. Brook Amundson C. Silva.  

## 2015-06-19 ENCOUNTER — Telehealth: Payer: Self-pay | Admitting: Nurse Practitioner

## 2015-06-19 LAB — IPS PAP TEST WITH HPV

## 2015-06-19 NOTE — Telephone Encounter (Signed)
Left voicemail regarding referral appointment. The information is listed below. Should the patient need to cancel or reschedule this appointment, please advise them to call the office they've been referred to in order to reschedule.  [facility] [address] [place] [id number] [phone]  Appointment scheduled for 07/20/15 @ 10am with Dr Talmage NapBalan  Please arrive 15 minutes early with incurance card and picture id.

## 2016-04-17 ENCOUNTER — Ambulatory Visit (INDEPENDENT_AMBULATORY_CARE_PROVIDER_SITE_OTHER): Payer: Commercial Managed Care - PPO | Admitting: Family Medicine

## 2016-04-17 ENCOUNTER — Encounter: Payer: Self-pay | Admitting: Family Medicine

## 2016-04-17 VITALS — BP 116/80 | HR 68 | Resp 12 | Ht 61.25 in | Wt 174.1 lb

## 2016-04-17 DIAGNOSIS — IMO0002 Reserved for concepts with insufficient information to code with codable children: Secondary | ICD-10-CM

## 2016-04-17 DIAGNOSIS — R5382 Chronic fatigue, unspecified: Secondary | ICD-10-CM

## 2016-04-17 DIAGNOSIS — Z6832 Body mass index (BMI) 32.0-32.9, adult: Secondary | ICD-10-CM

## 2016-04-17 DIAGNOSIS — G4700 Insomnia, unspecified: Secondary | ICD-10-CM | POA: Diagnosis not present

## 2016-04-17 DIAGNOSIS — R1011 Right upper quadrant pain: Secondary | ICD-10-CM | POA: Diagnosis not present

## 2016-04-17 DIAGNOSIS — Z0000 Encounter for general adult medical examination without abnormal findings: Secondary | ICD-10-CM

## 2016-04-17 DIAGNOSIS — E041 Nontoxic single thyroid nodule: Secondary | ICD-10-CM

## 2016-04-17 LAB — COMPREHENSIVE METABOLIC PANEL
ALT: 20 U/L (ref 0–35)
AST: 16 U/L (ref 0–37)
Albumin: 4.2 g/dL (ref 3.5–5.2)
Alkaline Phosphatase: 53 U/L (ref 39–117)
BUN: 15 mg/dL (ref 6–23)
CALCIUM: 8.9 mg/dL (ref 8.4–10.5)
CHLORIDE: 107 meq/L (ref 96–112)
CO2: 30 meq/L (ref 19–32)
Creatinine, Ser: 0.74 mg/dL (ref 0.40–1.20)
GFR: 95.38 mL/min (ref >60.00)
Glucose, Bld: 97 mg/dL (ref 70–99)
POTASSIUM: 5.2 meq/L — AB (ref 3.5–5.1)
Sodium: 139 mEq/L (ref 135–145)
Total Bilirubin: 0.4 mg/dL (ref 0.2–1.2)
Total Protein: 6.6 g/dL (ref 6.0–8.3)

## 2016-04-17 LAB — CBC
HCT: 41.4 % (ref 36.0–46.0)
HEMOGLOBIN: 13.6 g/dL (ref 12.0–15.0)
MCHC: 32.8 g/dL (ref 30.0–36.0)
MCV: 76.2 fl — AB (ref 78.0–100.0)
Platelets: 217 10*3/uL (ref 150.0–400.0)
RBC: 5.43 Mil/uL — ABNORMAL HIGH (ref 3.87–5.11)
RDW: 14.4 % (ref 11.5–15.5)
WBC: 6.4 10*3/uL (ref 4.0–10.5)

## 2016-04-17 NOTE — Progress Notes (Signed)
Pre visit review using our clinic review tool, if applicable. No additional management support is needed unless otherwise documented below in the visit note. 

## 2016-04-17 NOTE — Patient Instructions (Signed)
A few things to remember from today's visit:   Thyroid nodule - Plan: Ambulatory referral to Endocrinology, TSH, T4, free, T3, free  Chronic fatigue - Plan: TSH, VITAMIN D 25 Hydroxy (Vit-D Deficiency, Fractures), CBC, CMP  RUQ pain  Insomnia, unspecified  Patient requested test - Plan: ABO AND RH   What are some tips for weight loss? People become overweight for many reasons. Weight issues can run in families. They can be caused by unhealthy behaviors and a person's environment. Certain health problems and medicines can also lead to weight gain. There are some simple things you can do to reach and maintain a healthy weight:  Eat small more frequent healthy meals instead 3 bid meals. Also Weight Watchers is a good option. Avoid sweet drinks. These include regular soft drinks, fruit juices, fruit drinks, energy drinks, sweetened iced tea, and flavored milk. Avoid fast foods. Fast foods such as french fries, hamburgers, chicken nuggets, and pizza are high in calories and can cause weight gain. Eat a healthy breakfast. People who skip breakfast tend to weigh more. Don't watch more than two hours of television per day. Chew sugar-free gum between meals to cut down on snacking. Avoid grocery shopping when you're hungry. Pack a healthy lunch instead of eating out to control what and how much you eat. Eat a lot of fruits and vegetables. Aim for about 2 cups of fruit and 2 to 3 cups of vegetables per day. Aim for 150 minutes per week of moderate-intensity exercise (such as brisk walking), or 75 minutes per week of vigorous exercise (such as jogging or running). OR 15-30 min of daily brisk walking. Be more active. Small changes in physical activity can easily be added to your daily routine. For example, take the stairs instead of the elevator. Take a walk with your family. A daily walk is a great way to get exercise and to catch up on the day's events.  It is a common symptom associated with  multiple factors: psychologic,medications, systemic illness, sleep disorders,infections, and unknown causes. Some work-up can be done to evaluate for common causes as thyroid disease,anemia,diabetes, or abnormalities in calcium,potassium,or sodium. Regular physical activity as tolerated and a healthy diet is usually might help and usually recommended for chronic fatigue.  Please be sure medication list is accurate. If a new problem present, please set up appointment sooner than planned today.

## 2016-04-17 NOTE — Progress Notes (Signed)
HPI:   Robin Burke is a 35 y.o. female, who is here today to establish care with me.  Former PCP: Dr Artist PaisYoo Last preventive routine visit: 06/2015 with her gynecologists.  Concerns today: Requesting referral to endocrinologists. She states that she has Hx of thyroid nodules, underwent thyroid Bx in 2015 and was "undeterminant." She states that she was supposed to follow with endocrinologists, Dr Elvera LennoxGherghe, but she would like to see another endocrinologists, Dr Altheimer.  -She is also concerned about wt gaining in the past year, about 20 pounds. She states that she has not changed her diet, her job is not as sedentary as it was before but she does not exercise regularly. She would like "whole panel" of thyroid labs, she would like thyroid abs included. States that her gynecologists ordered thyroid function test but "just one."  -Fatigue: For a "while" but worse for the past 2-3 months. She sometimes has trouble falling/staying asleep but even if she sleeps well she still feels tired. Not aware of sleep apnea. + Stress. No associated arthralgias, skin rash, hair loss, or oral lesions. She denies Hx of anxiety or depression. LMP 2 weeks ago, not heavy, denies Hx of anemia.   -At least 6 months of RUQ/right rib cage sharp,intermittent pain, sudden, last 5 minutes or so. No associated with food intake. She had some nausea yesterday but was not having pain. Pain is not radiated, 4/10, getting more frequent, now ones weekly.  It does not interfere with her daily activities. She has not identified exacerbating factors and usually goes away spontaneously.  Denies vomiting, changes in bowel habits, blood in stool or melena.  No cough, dyspnea, wheezing, or Hx of trauma.   -LE edema she has noted for a few months, she states that she notices after eating dinner, she is not sure if this is related to salt amount in food. No pain or erythema. She denies gross hematuria, foam in  urine, or decreased urine output.   -She is also requesting blood typing done, states that she is planning on starting a vegetarian diet with her husband and it will not work with certain blood types.   Review of Systems  Constitutional: Positive for fatigue and unexpected weight change. Negative for activity change, appetite change, diaphoresis and fever.  HENT: Negative for mouth sores, nosebleeds and trouble swallowing.   Eyes: Negative for redness and visual disturbance.  Respiratory: Negative for cough, shortness of breath and wheezing.   Cardiovascular: Positive for leg swelling. Negative for chest pain and palpitations.  Gastrointestinal: Positive for abdominal pain (RUQ). Negative for abdominal distention, blood in stool and vomiting.       Negative for changes in bowel habits.  Endocrine: Negative for cold intolerance, heat intolerance, polydipsia, polyphagia and polyuria.  Genitourinary: Negative for decreased urine volume, difficulty urinating, dysuria and hematuria.  Musculoskeletal: Negative for arthralgias, gait problem and myalgias.  Skin: Negative for pallor and rash.  Neurological: Negative for seizures, syncope, weakness, numbness and headaches.  Hematological: Negative for adenopathy. Does not bruise/bleed easily.  Psychiatric/Behavioral: Positive for sleep disturbance. Negative for confusion. The patient is nervous/anxious.       Current Outpatient Prescriptions on File Prior to Visit  Medication Sig Dispense Refill  . lisdexamfetamine (VYVANSE) 40 MG capsule Take 40 mg by mouth as needed.     No current facility-administered medications on file prior to visit.      Past Medical History:  Diagnosis Date  . HPV in female  2006   w/cryo, normal since  . STD (female) 2010   HSV I & II  by serology   No Known Allergies  Family History  Problem Relation Age of Onset  . Diabetes Mother   . Hyperlipidemia Mother   . Coronary artery disease Father     Had 1  stent placed  . Thyroid cancer Maternal Grandmother   . Osteoarthritis Maternal Grandmother   . Diabetes Brother     Social History   Social History  . Marital status: Married    Spouse name: N/A  . Number of children: 0  . Years of education: N/A   Occupational History  . 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 Smoking status: Former Smoker    Packs/day: 0.25    Years: 10.00    Types: Cigarettes    Quit date: 09/13/2011  . Smokeless tobacco: Never Used  . Alcohol use 1.2 - 1.8 oz/week    2 - 3 Glasses of wine per week  . Drug use: No  . Sexual activity: Yes    Partners: Male    Birth control/ protection: Condom     Comment: some of the time   Other Topics Concern  . None   Social History Narrative   She moved to [place] one half years ago. She previously lived in [place] and in [place].   She is half [place] and half [place].   She works for [employer]    Vitals:   04/17/16 0901  BP: 116/80  Pulse: 68  Resp: 12    Body mass index is 32.63 kg/m.    Physical Exam  Nursing note and vitals reviewed. Constitutional: She is oriented to person, place, and time. She appears well-developed. No distress.  HENT:  Head: Atraumatic.  Mouth/Throat: Oropharynx is clear and moist and mucous membranes are normal.  Eyes: Conjunctivae and EOM are normal. Pupils are equal, round, and reactive to light.  Neck: No tracheal deviation present. Thyromegaly present.  Cardiovascular: Normal rate and regular rhythm.   No murmur heard. Pulses:      Dorsalis pedis pulses are 2+ on the right side, and 2+ on the left side.  Respiratory: Effort normal and breath sounds normal. No respiratory distress. She exhibits no tenderness.  GI: Soft. She exhibits no mass. There is no hepatomegaly. There is no tenderness.  Musculoskeletal: She exhibits no edema or tenderness.  Lymphadenopathy:    She has no cervical adenopathy.  Neurological: She is alert  and oriented to person, place, and time. She has normal strength. No cranial nerve deficit. Coordination and gait normal.  Skin: Skin is warm. No erythema.  Psychiatric: Her speech is normal. Her mood appears anxious. Her affect is blunt.  Poor groomed, good eye contact.      ASSESSMENT AND PLAN:     Arlin was seen today for new patient (initial visit).  Diagnoses and all orders for this visit:  Thyroid nodule  Endocrinology referral placed. Thyroid labs ordered, I will let endocrinologists to decide about need for thyroid abs. Current recommendations in regard to thyroid disease screening discussed.  -     Ambulatory referral to Endocrinology -     TSH -     T4, free -     T3, free  Chronic fatigue  We discussed possible causes, frequently cause is not identified. She has fatigue on her prior Dx, 2015. ? Anxiety. Further recommendations will be given  according to lab results.  -     TSH -     VITAMIN D 25 Hydroxy (Vit-D Deficiency, Fractures) -     CBC -     CMP  RUQ pain  Abdominal examination negative today. Hx does not suggest gallbladder disease. ? Musculoskeletal. Today some labs ordered. May consider imaging in the future but at this time I do not think it is necessary.  Insomnia, unspecified  Could contribute to fatigue, she disagrees. Good sleep hygiene. Consider OTC sleep aids. Melatonin ER. F/U as needed.    BMI 32.0-32.9,adult  We discussed benefits of wt loss as well as adverse effects of obesity. Consistency with healthy diet and physical activity recommended. Weight Watchers is a good option as well as daily brisk walking for 15-30 min as tolerated.  Patient requested test -     Cancel: ABO AND RH  Explained that insurance may not pay for this test, still she wanted done, so order placed.Right before she left she tells me that she doe snot longer wants this lab done.         Betty G. [place], MD  [facility]. [place] office.

## 2016-04-18 ENCOUNTER — Ambulatory Visit: Payer: Commercial Managed Care - PPO | Admitting: Family Medicine

## 2016-04-18 ENCOUNTER — Encounter: Payer: Self-pay | Admitting: Family Medicine

## 2016-04-18 ENCOUNTER — Telehealth: Payer: Self-pay | Admitting: Family Medicine

## 2016-04-18 ENCOUNTER — Other Ambulatory Visit: Payer: Self-pay

## 2016-04-18 DIAGNOSIS — E041 Nontoxic single thyroid nodule: Secondary | ICD-10-CM

## 2016-04-18 LAB — TSH: TSH: 1.7 u[IU]/mL (ref 0.35–4.50)

## 2016-04-18 LAB — T4, FREE: Free T4: 0.94 ng/dL (ref 0.60–1.60)

## 2016-04-18 LAB — VITAMIN D 25 HYDROXY (VIT D DEFICIENCY, FRACTURES): VITD: 18.15 ng/mL — ABNORMAL LOW (ref 30.00–100.00)

## 2016-04-18 LAB — T3, FREE: T3, Free: 3.1 pg/mL (ref 2.3–4.2)

## 2016-04-18 NOTE — Telephone Encounter (Signed)
I called and spoke with [facility]. They just want her last report before ordering any tests. Will fax reports over.

## 2016-04-18 NOTE — Telephone Encounter (Signed)
Called and spoke with patient. She does not want to go to [facility]. I will put a new referral in for [facility]. Patient is not sure if they will want a ultrasound first, so can you advise on that part?

## 2016-04-18 NOTE — Telephone Encounter (Signed)
Reports faxed to [facility].

## 2016-04-18 NOTE — Telephone Encounter (Signed)
Pt would like dr Swazilandjordan to call her personally. Pt does not want to see dr Wyonia Houghgerghe she just want a biopsy

## 2016-04-18 NOTE — Telephone Encounter (Signed)
° °  Pt call to say that she requested her labs not be sent to Dr Wyonia HoughGerghe the Endocrinology office and they were because she received a call from them asking her to set up an appt. She did not set up an appointment with them  Because she looking to have a second opinion.  Would like a call back  [phone]

## 2016-04-19 ENCOUNTER — Other Ambulatory Visit: Payer: Self-pay

## 2016-04-19 ENCOUNTER — Telehealth: Payer: Self-pay | Admitting: Family Medicine

## 2016-04-19 MED ORDER — VITAMIN D (ERGOCALCIFEROL) 1.25 MG (50000 UNIT) PO CAPS: 50000.0000 [IU] | ORAL_CAPSULE | ORAL | 1 refills | Status: DC

## 2016-04-19 NOTE — Telephone Encounter (Signed)
Pt state that she need to get a referral to [facility] for Monday 04/22/16 1:10  for the Ultrasound (pt has already called and they just need the referral in order to schedule her at that time).

## 2016-04-19 NOTE — Telephone Encounter (Signed)
Sent patient message via Earleen Reapermychart asking if she has heard from [facility]. Dr. Leslie DalesAltheimer wanted to review everything first before deciding about an ultrasound.

## 2016-04-22 ENCOUNTER — Telehealth: Payer: Self-pay | Admitting: Internal Medicine

## 2016-04-22 ENCOUNTER — Ambulatory Visit
Admission: RE | Admit: 2016-04-22 | Discharge: 2016-04-22 | Disposition: A | Discharge status: Home / Self Care | Payer: Commercial Managed Care - PPO | Source: Ambulatory Visit | Attending: Internal Medicine | Admitting: Internal Medicine

## 2016-04-22 ENCOUNTER — Telehealth: Payer: Self-pay

## 2016-04-22 ENCOUNTER — Other Ambulatory Visit: Payer: Self-pay | Admitting: Internal Medicine

## 2016-04-22 ENCOUNTER — Encounter: Payer: Self-pay | Admitting: Family Medicine

## 2016-04-22 DIAGNOSIS — E042 Nontoxic multinodular goiter: Secondary | ICD-10-CM

## 2016-04-22 NOTE — Telephone Encounter (Signed)
She needs an appointment. I looked over her chart, latest thyroid tests from few days ago are normal.

## 2016-04-22 NOTE — Telephone Encounter (Signed)
See below

## 2016-04-22 NOTE — Telephone Encounter (Signed)
Pt has questions about follow up and is in need of an appt for thyroid, she wants to get ultrasound done asap and said [facility] has an opening today she had labs done last week  Pt has not been seen since 2015

## 2016-04-22 NOTE — Telephone Encounter (Signed)
Called and advised patient that she needed an appointment, patient states that she needs to have the ultrasound done, got patient ultrasound order in per Dr.Gherghe. Patient does have appointment in November, but will put on waiting list if anyone cancels.

## 2016-04-22 NOTE — Telephone Encounter (Signed)
Pt states Dr Altheimer is out of town for a week and she adamant and persistent about getting the ultrasound today.   imaging has an appointment at 1:30 today and pt would like to have that appointment. Pt states she has known about this nodule for over 2 yrs, dr Artist Paisyoo did a referral for the ultrasound, and she would like Dr Swazil[place]Jordan to do the same.. Pt would like Dr Swazil[place]Jordan to call her.

## 2016-05-20 ENCOUNTER — Telehealth: Payer: Self-pay | Admitting: Internal Medicine

## 2016-05-20 NOTE — Telephone Encounter (Signed)
Patient stated that she got Dr Elvera LennoxGherghe my chart message for the re biopsy,she said she would do it . Please advise

## 2016-05-21 ENCOUNTER — Other Ambulatory Visit: Payer: Self-pay | Admitting: Internal Medicine

## 2016-05-21 ENCOUNTER — Telehealth: Payer: Self-pay

## 2016-05-21 DIAGNOSIS — E041 Nontoxic single thyroid nodule: Secondary | ICD-10-CM

## 2016-05-21 NOTE — Telephone Encounter (Signed)
I placed the order for the thyroid nodule biopsy. Per review of the chart, patient did not want to return to see me. Did she change her mind or would she prefer to see one of my colleagues?

## 2016-05-21 NOTE — Telephone Encounter (Signed)
Patient stated that she does want to continue to see you. I advised they would contact her about the biopsy. Patient had no questions at this time.

## 2016-06-06 ENCOUNTER — Other Ambulatory Visit: Payer: Self-pay | Admitting: Internal Medicine

## 2016-06-06 ENCOUNTER — Telehealth: Payer: Self-pay | Admitting: Internal Medicine

## 2016-06-06 ENCOUNTER — Telehealth: Payer: Self-pay

## 2016-06-06 DIAGNOSIS — E041 Nontoxic single thyroid nodule: Secondary | ICD-10-CM

## 2016-06-06 NOTE — Telephone Encounter (Signed)
Arena with [facility] # [phone]  Calling regarding the thyroid biopsy EPIC order the pt is very anxious please call Arena ASAP

## 2016-06-06 NOTE — Telephone Encounter (Signed)
I discussed with Dr. Miles CostainShick and they will schedule her for biopsy.

## 2016-06-06 NOTE — Telephone Encounter (Signed)
Called and notified Arena to schedule biopsy per Dr.Gherghe message with speaking with Dr.Shick.

## 2016-06-07 ENCOUNTER — Ambulatory Visit (HOSPITAL_COMMUNITY): Admission: RE | Admit: 2016-06-07 | Payer: Commercial Managed Care - PPO | Source: Ambulatory Visit

## 2016-06-17 ENCOUNTER — Ambulatory Visit: Payer: Commercial Managed Care - PPO | Admitting: Nurse Practitioner

## 2016-06-19 ENCOUNTER — Ambulatory Visit (HOSPITAL_COMMUNITY): Admission: RE | Admit: 2016-06-19 | Payer: Commercial Managed Care - PPO | Source: Ambulatory Visit

## 2016-06-24 ENCOUNTER — Ambulatory Visit: Payer: Commercial Managed Care - PPO | Admitting: Internal Medicine

## 2016-07-01 ENCOUNTER — Ambulatory Visit: Payer: Commercial Managed Care - PPO | Admitting: Nurse Practitioner

## 2016-10-25 ENCOUNTER — Telehealth: Payer: Self-pay | Admitting: Family Medicine

## 2016-10-25 NOTE — Telephone Encounter (Signed)
Pt is having upper abd pain and thinks its her gallbladder. Pt is sch for 10-28-16

## 2016-10-25 NOTE — Telephone Encounter (Signed)
Pt scheduled to see Dr. SwazilandJordan 10/28/16. Nothing further needed at this time.

## 2016-10-25 NOTE — Telephone Encounter (Signed)
LMTCB

## 2016-10-28 ENCOUNTER — Ambulatory Visit (INDEPENDENT_AMBULATORY_CARE_PROVIDER_SITE_OTHER): Payer: BLUE CROSS/BLUE SHIELD | Admitting: Family Medicine

## 2016-10-28 ENCOUNTER — Encounter: Payer: Self-pay | Admitting: Family Medicine

## 2016-10-28 VITALS — BP 118/78 | HR 63 | Resp 12 | Ht 61.25 in | Wt 164.1 lb

## 2016-10-28 DIAGNOSIS — R1013 Epigastric pain: Secondary | ICD-10-CM | POA: Diagnosis not present

## 2016-10-28 DIAGNOSIS — E875 Hyperkalemia: Secondary | ICD-10-CM | POA: Diagnosis not present

## 2016-10-28 DIAGNOSIS — E559 Vitamin D deficiency, unspecified: Secondary | ICD-10-CM | POA: Diagnosis not present

## 2016-10-28 LAB — COMPREHENSIVE METABOLIC PANEL
ALT: 16 U/L (ref 0–35)
AST: 14 U/L (ref 0–37)
Albumin: 3.9 g/dL (ref 3.5–5.2)
Alkaline Phosphatase: 53 U/L (ref 39–117)
BUN: 15 mg/dL (ref 6–23)
CALCIUM: 9.2 mg/dL (ref 8.4–10.5)
CHLORIDE: 104 meq/L (ref 96–112)
CO2: 29 mEq/L (ref 19–32)
Creatinine, Ser: 0.78 mg/dL (ref 0.40–1.20)
GFR: 89.47 mL/min (ref >60.00)
Glucose, Bld: 94 mg/dL (ref 70–99)
Potassium: 4.6 mEq/L (ref 3.5–5.1)
Sodium: 139 mEq/L (ref 135–145)
Total Bilirubin: 0.5 mg/dL (ref 0.2–1.2)
Total Protein: 6 g/dL (ref 6.0–8.3)

## 2016-10-28 LAB — CBC
HEMATOCRIT: 38.9 % (ref 36.0–46.0)
Hemoglobin: 12.5 g/dL (ref 12.0–15.0)
MCHC: 32.3 g/dL (ref 30.0–36.0)
MCV: 78.8 fl (ref 78.0–100.0)
PLATELETS: 199 10*3/uL (ref 150.0–400.0)
RBC: 4.93 Mil/uL (ref 3.87–5.11)
RDW: 13.8 % (ref 11.5–15.5)
WBC: 5.3 10*3/uL (ref 4.0–10.5)

## 2016-10-28 LAB — VITAMIN D 25 HYDROXY (VIT D DEFICIENCY, FRACTURES): VITD: 15.2 ng/mL — ABNORMAL LOW (ref 30.00–100.00)

## 2016-10-28 NOTE — Patient Instructions (Signed)
A few things to remember from today's visit:   Abdominal pain, epigastric - Plan: US Abdomen Limited RUQ, Comprehensive metabolic panel, CBC  Hyperkalemia    Avoid foods that make your symptoms worse, for example coffee, chocolate,pepermeint,alcohol, and greasy food. Raising the head of your bed about 6 inches may help with nocturnal symptoms.  Avoid tobacco use. Weight loss (if you are overweight). Avoid lying down for 3 hours after eating. Decrease alcohol intake  Instead 3 large meals daily try small and more frequent meals during the day.  Every medication have side effects and medications for GERD are not the exception.At this time I think benefit is greater than risk.  There has been some concerns about dementia and medications like Omeprazole or Nexium (PPI) but recent studies do not show a relation. Also kidney function can be affected among some patients that take these type of medications, we will follow accordingly. Taking these medications for long term could increase risk of osteoporosis (some debate now), vitamin deficiencies (Vit D and B12 specialty), increases risk of pneumonia.  You should be evaluated immediately if bloody vomiting, bloody stools, black stools (like tar), difficulty swallowing, food gets stuck on the way down or choking when eating. Abnormal weight loss or severe abdominal pain.  Marland Kitchen.  Please be sure medication list is accurate. If a new problem present, please set up appointment sooner than planned today.

## 2016-10-28 NOTE — Progress Notes (Signed)
HPI:   ACUTE VISIT:  Chief Complaint  Patient presents with  . abdominal pain    [name] is a 36 y.o. female, who is here today complaining of a months of intermittent upper abdominal pain. She has had about 3 episodes. Epigastric and LUQ pain, burning,aching,no radiated.  Last week pain woke her up and was associated with nausea and vomiting, she vomited 3 times. Yellowish and greening vomiting, felt better. She thinks it could be her gallbladder.  It seems to be exacerbated by fatty food.  + Occasional burping and heartburn, 1-2 times per week.  She describes pain "between discomfort and pain", 6/10, starts about an hour after eating,and lasts a few hours.  She has treated symptoms with baking soda.  She has avoided fatty food since last week and feeling better, ate cheese with salad a few days ago and felt some pain but not as intense.  Chills when she was vomiting,no fever, odynophagia,dysphagia,changes in bowel habits, blood in stool or melena.    -Hx of vit D deficiency, she is not longer on Ergocalciferol 50,000 U, completed treatment a couple months ago and did not start OTC supplementation.  -Alcohol: 3-4 glasses, 6 oz each, fridays and Saturdays, states that this is less than she used to.  04/2016 K+ was 5.2.   Review of Systems  Constitutional: Positive for fatigue. Negative for activity change, appetite change and fever.  HENT: Negative for mouth sores, sore throat and trouble swallowing.   Respiratory: Negative for cough, shortness of breath and wheezing.   Cardiovascular: Negative for chest pain and leg swelling.  Gastrointestinal: Positive for abdominal pain, nausea and vomiting. Negative for blood in stool and diarrhea.       No changes in bowel habits.  Endocrine: Negative for cold intolerance and heat intolerance.  Genitourinary: Negative for decreased urine volume, dysuria and hematuria.  Musculoskeletal: Negative for back pain and  myalgias.  Skin: Negative for rash.  Allergic/Immunologic: Negative for food allergies.  Neurological: Negative for syncope, weakness and headaches.  Hematological: Negative for adenopathy. Does not bruise/bleed easily.  Psychiatric/Behavioral: Negative for confusion. The patient is nervous/anxious.       Current Outpatient Prescriptions on File Prior to Visit  Medication Sig Dispense Refill  . lisdexamfetamine (VYVANSE) 40 MG capsule Take 40 mg by mouth as needed.     No current facility-administered medications on file prior to visit.      Past Medical History:  Diagnosis Date  . 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 STD (female) 2010   HSV I & II  by serology   No Known Allergies  Social History   Social History  . Marital status: Married    Spouse name: N/A  . Number of children: 0  . Years of education: N/A   Occupational History  . 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 Smoking status: Former Smoker    Packs/day: 0.25    Years: 10.00    Types: Cigarettes    Quit date: 09/13/2011  . Smokeless tobacco: Never Used  . Alcohol use 1.2 - 1.8 oz/week    2 - 3 Glasses of wine per week  . Drug use: No  . Sexual activity: Yes    Partners: Male    Birth control/ protection: Condom     Comment: some of the time   Other Topics Concern  . None   Social History  Narrative   She moved to [place] one half years ago. She previously lived in [place] and in [place].   She is half [place] and half [place].   She works for [employer]    Vitals:   10/28/16 0834  BP: 118/78  Pulse: 63  Resp: 12  O2 sat 98% at RA. Body mass index is 30.76 kg/m.   Physical Exam  Nursing note and vitals reviewed. Constitutional: She is oriented to person, place, and time. She appears well-developed. She does not appear ill. No distress.  HENT:  Head: Atraumatic.  Mouth/Throat: Oropharynx is clear and moist and mucous membranes are  normal.  Eyes: Conjunctivae and EOM are normal. No scleral icterus.  Cardiovascular: Normal rate and regular rhythm.   No murmur heard. Respiratory: Effort normal and breath sounds normal. No respiratory distress.  GI: Soft. Bowel sounds are normal. She exhibits no distension and no mass. There is no hepatomegaly. There is no tenderness.  Musculoskeletal: She exhibits no edema.  Lymphadenopathy:    She has no cervical adenopathy.       Right: No supraclavicular adenopathy present.       Left: No supraclavicular adenopathy present.  Neurological: She is alert and oriented to person, place, and time. She has normal strength.  Skin: Skin is warm. No rash noted. No erythema.  Psychiatric: She has a normal mood and affect. Her mood appears not anxious.  Well groomed, good eye contact.      ASSESSMENT AND PLAN:   Robin Burke was seen today for abdominal pain.  Diagnoses and all orders for this visit:  Abdominal pain, epigastric  We discussed possible causes: GERD,gallbladder disease,dyspepsia among some. She is not interested in PPI trial. Continue avoiding foods that can trigger symptom. Instructed about warning signs. Further recommendations will be given according to lab/imaging results.   -     US Abdomen Limited RUQ; Future -     Comprehensive metabolic panel -     CBC  Hyperkalemia  Mild. Further recommendations will be given according to lab results.  Vitamin D deficiency  Continue OTC Vit D 1000 U for now, will follow labs done today and will give further recommendations accordingly.  -     VITAMIN D 25 Hydroxy (Vit-D Deficiency, Fractures)   Return if symptoms worsen or fail to improve.     -[name] was advised to return or notify a doctor immediately if symptoms worsen or persist or new concerns arise.      Devlin Mcveigh G. Swazil[place]Jordan, MD  [facility]. [place] office.

## 2016-10-28 NOTE — Progress Notes (Signed)
Pre visit review using our clinic review tool, if applicable. No additional management support is needed unless otherwise documented below in the visit note. 

## 2016-11-01 ENCOUNTER — Encounter: Payer: Self-pay | Admitting: Family Medicine

## 2016-11-07 ENCOUNTER — Ambulatory Visit
Admission: RE | Admit: 2016-11-07 | Discharge: 2016-11-07 | Disposition: A | Discharge status: Home / Self Care | Payer: BLUE CROSS/BLUE SHIELD | Source: Ambulatory Visit | Attending: Family Medicine | Admitting: Family Medicine

## 2016-11-07 ENCOUNTER — Encounter: Payer: Self-pay | Admitting: Family Medicine

## 2016-11-07 DIAGNOSIS — K8020 Calculus of gallbladder without cholecystitis without obstruction: Secondary | ICD-10-CM | POA: Diagnosis not present

## 2016-11-07 DIAGNOSIS — R1013 Epigastric pain: Secondary | ICD-10-CM

## 2016-11-13 ENCOUNTER — Encounter: Payer: Self-pay | Admitting: Family Medicine

## 2016-11-13 ENCOUNTER — Other Ambulatory Visit: Payer: Self-pay | Admitting: Family Medicine

## 2016-11-13 DIAGNOSIS — K8020 Calculus of gallbladder without cholecystitis without obstruction: Secondary | ICD-10-CM

## 2016-12-04 ENCOUNTER — Ambulatory Visit: Payer: Self-pay | Admitting: General Surgery

## 2016-12-04 DIAGNOSIS — K8020 Calculus of gallbladder without cholecystitis without obstruction: Secondary | ICD-10-CM | POA: Diagnosis not present

## 2016-12-04 NOTE — H&P (Signed)
History of Present Illness Robin Filler MD; 12/04/2016 2:35 PM) The patient is a 36 year old female who presents for evaluation of gall stones. The patient is a she's had several bouts of epigastric abdominal pain that sometimes reduced right upper quadrant/right back area. Patient states this usually follows a high fatty meal. She states that recently she underwent a high fatty diet in the pain has subsided. Patient states that usually when she has pain associated with some nausea and vomiting, reflux. She has stated that she has had some jaundice in the past as well as dark-colored urine.  Patient denies any fevers, chills, constipation, diarrhea. All other reviews systems are negative.  Patient on ultrasound which revealed: IMPRESSION: Gallstones without sonographic evidence of acute cholecystitis. Normal appearance of the liver and common bile duct.    Past Surgical History Robin Burke, [place]; 12/04/2016 2:09 PM) No pertinent past surgical history   Diagnostic Studies History Robin Burke, [place]; 12/04/2016 2:09 PM) Colonoscopy  never Mammogram  never Pap Smear  1-5 years ago  Allergies Robin Burke, RMA; 12/04/2016 2:09 PM) No Known Allergies 12/04/2016  Medication History Robin Burke, RMA; 12/04/2016 2:09 PM) No Current Medications Medications Reconciled  Social History Robin Burke, [place]; 12/04/2016 2:09 PM) Alcohol use  Occasional alcohol use. Caffeine use  Coffee. No drug use  Tobacco use  Former smoker.  Family History Robin Burke, [place]; 12/04/2016 2:09 PM) Arthritis  Mother. Diabetes Mellitus  Brother. Heart Disease  Father. Heart disease in female family member before age 45  Hypertension  Father, Mother. Migraine Headache  Mother.  Pregnancy / Birth History Robin Burke, [place]; 12/04/2016 2:09 PM) Age at menarche  12 years. Contraceptive History  Oral contraceptives. Gravida  0 Para  0 Regular periods    Other Problems Robin Burke, RMA; 12/04/2016 2:09 PM) Cholelithiasis  Hemorrhoids     Review of Systems Robin Burke RMA; 12/04/2016 2:09 PM) General Present- Chills and Fever. Not Present- Appetite Loss, Fatigue, Night Sweats, Weight Gain and Weight Loss. Skin Present- Jaundice. Not Present- Change in Wart/Mole, Dryness, Hives, New Lesions, Non-Healing Wounds, Rash and Ulcer. HEENT Present- Oral Ulcers and Seasonal Allergies. Not Present- Earache, Hearing Loss, Hoarseness, Nose Bleed, Ringing in the Ears, Sinus Pain, Sore Throat, Visual Disturbances, Wears glasses/contact lenses and Yellow Eyes. Respiratory Present- Difficulty Breathing. Not Present- Bloody sputum, Chronic Cough, Snoring and Wheezing. Breast Not Present- Breast Mass, Breast Pain, Nipple Discharge and Skin Changes. Cardiovascular Present- Chest Pain and Difficulty Breathing Lying Down. Not Present- Leg Cramps, Palpitations, Rapid Heart Rate, Shortness of Breath and Swelling of Extremities. Gastrointestinal Present- Bloating, Excessive gas, Indigestion, Nausea and Vomiting. Not Present- Abdominal Pain, Bloody Stool, Change in Bowel Habits, Chronic diarrhea, Constipation, Difficulty Swallowing, Gets full quickly at meals, Hemorrhoids and Rectal Pain. Female Genitourinary Not Present- Frequency, Nocturia, Painful Urination, Pelvic Pain and Urgency. Musculoskeletal Present- Back Pain. Not Present- Joint Pain, Joint Stiffness, Muscle Pain, Muscle Weakness and Swelling of Extremities. Neurological Present- Headaches. Not Present- Decreased Memory, Fainting, Numbness, Seizures, Tingling, Tremor, Trouble walking and Weakness. Psychiatric Not Present- Anxiety, Bipolar, Change in Sleep Pattern, Depression, Fearful and Frequent crying. Endocrine Not Present- Cold Intolerance, Excessive Hunger, Hair Changes, Heat Intolerance, Hot flashes and New Diabetes. Hematology Not Present- Blood Thinners, Easy Bruising, Excessive  bleeding, Gland problems, HIV and Persistent Infections.  Vitals Robin Burke RMA; 12/04/2016 2:10 PM) 12/04/2016 2:09 PM Weight: 156.6 lb Height: 61in Body Surface Area: 1.7 m Body Mass Index: 29.59 kg/m  Temp.: 97.40F  Pulse: 78 (Regular)  BP: 120/70 (Sitting, Left Arm, Standard)       Physical Exam Robin Filler, MD; 12/04/2016 2:36 PM) General Mental Status-Alert. General Appearance-Consistent with stated age. Hydration-Well hydrated. Voice-Normal.  Head and Neck Head-normocephalic, atraumatic with no lesions or palpable masses.  Eye Eyeball - Bilateral-Extraocular movements intact. Sclera/Conjunctiva - Bilateral-No scleral icterus.  Chest and Lung Exam Chest and lung exam reveals -quiet, even and easy respiratory effort with no use of accessory muscles. Inspection Chest Wall - Normal. Back - normal.  Cardiovascular Cardiovascular examination reveals -normal heart sounds, regular rate and rhythm with no murmurs.  Abdomen Inspection Normal Exam - No Hernias. Palpation/Percussion Normal exam - Soft, Non Tender, No Rebound tenderness, No Rigidity (guarding) and No hepatosplenomegaly. Auscultation Normal exam - Bowel sounds normal.  Neurologic Neurologic evaluation reveals -alert and oriented x 3 with no impairment of recent or remote memory. Mental Status-Normal.  Musculoskeletal Normal Exam - Left-Upper Extremity Strength Normal and Lower Extremity Strength Normal. Normal Exam - Right-Upper Extremity Strength Normal, Lower Extremity Weakness.    Assessment & Plan Robin Filler MD; 12/04/2016 2:35 PM) SYMPTOMATIC CHOLELITHIASIS (K80.20) Impression: Patient is a 36 year old female with symptomatic cholelithiasis  1. We will proceed to the operating room for a laparoscopic cholecystectomy  2. Risks and benefits were discussed with the patient to generally include, but not limited to: infection, bleeding, possible  need for post op ERCP, damage to the bile ducts, bile leak, and possible need for further surgery. Alternatives were offered and described. All questions were answered and the patient voiced understanding of the procedure and wishes to proceed at this point with a laparoscopic cholecystectomy

## 2016-12-30 ENCOUNTER — Encounter: Payer: Self-pay | Admitting: Nurse Practitioner

## 2016-12-30 ENCOUNTER — Ambulatory Visit (INDEPENDENT_AMBULATORY_CARE_PROVIDER_SITE_OTHER): Payer: BLUE CROSS/BLUE SHIELD | Admitting: Nurse Practitioner

## 2016-12-30 VITALS — BP 118/66 | HR 60 | Ht 61.25 in | Wt 151.0 lb

## 2016-12-30 DIAGNOSIS — N760 Acute vaginitis: Secondary | ICD-10-CM

## 2016-12-30 DIAGNOSIS — N912 Amenorrhea, unspecified: Secondary | ICD-10-CM | POA: Diagnosis not present

## 2016-12-30 DIAGNOSIS — Z87891 Personal history of nicotine dependence: Secondary | ICD-10-CM | POA: Diagnosis not present

## 2016-12-30 DIAGNOSIS — K8020 Calculus of gallbladder without cholecystitis without obstruction: Secondary | ICD-10-CM | POA: Insufficient documentation

## 2016-12-30 DIAGNOSIS — R1011 Right upper quadrant pain: Secondary | ICD-10-CM | POA: Diagnosis not present

## 2016-12-30 NOTE — Patient Instructions (Signed)
Return in am for labs Schedule AEX

## 2016-12-30 NOTE — Progress Notes (Signed)
Reviewed personally.  M. Suzanne Neda Willenbring, MD.  

## 2016-12-30 NOTE — Progress Notes (Signed)
Patient ID: Robin Burke, female   DOB: June 02, 1981, 36 y.o.   MRN: [id number]  36 y.o. Divorced [place]ian/E[place]ian female [id number] here with complaint of vaginal symptoms of itching, burning, and increase discharge. Describes discharge as thick and white.  Now with new partner X 2 occasions and current partners since April.    Now noting inner labia "bumps or rough texture" since last week.  They are SA a lot and thinks this may be from irritation.  She also has a lesion that feels like HSV - history of this and does not use antivirals.  Onset of symptoms last week.  Denies new personal products or vaginal dryness. ? STD concerns. Urinary symptoms none . Contraception is condoms except for 1 occasion ?.  Menses is regular at 29 days. LMP 4/10/ 18 with being SA on 4/15 and took plan B on 4/16. Then on 4/23 had withdrawal bleed x 4 days.  No menses since. Husband was traveling all the time and they spent no time together so they divorced in October 2017.  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  Had a lapse in insurance coverage an did not come in for AEX.  Now new job since 09/2016.     Past Medical History:  Diagnosis Date  . 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 STD (female) 2010   HSV I & II  by serology    Past Surgical History:  Procedure Laterality Date  . WISDOM TOOTH EXTRACTION  2007    No current outpatient prescriptions on file.  ALLERGIES: Patient has no known allergies.  O:  Healthy female WDWN Affect: normal, orientation x 3  Physical Exam:  BP 118/66 (BP Location: Right Arm, Patient Position: Sitting, Cuff Size: Normal)   Pulse 60   Ht 5' 1.25" (1.556 m)   Wt 151 lb (68.5 kg)   LMP 11/19/2016 (Exact Date)   BMI 28.30 kg/m  General appearance: alert, cooperative and appears stated age Abdomen: soft and non tender Lymph node: no enlargement or tenderness Pelvic exam: External genital: normal  female with labial folds very prominent tissue.  No lesions that look like HPV.  One area top left labia majora that is an HSV lesion almost healed. BUS: negative Vagina: thick yellow to white discharge noted.  Affirm taken. Cervix: normal, non tender, no CMT Uterus: normal, non tender Adnexa:normal, non tender, no masses or fullness noted    A: Vaginitis  R/O STD's  History of HSV   P: Discussed findings of vaginitis and etiology. Discussed Aveeno or baking soda sitz bath for comfort. Avoid moist clothes or pads for extended period of time. If working out in gym clothes or swim suits for long periods of time change underwear or bottoms of swimsuit if possible. Olive Oil/Coconut Oil use for skin protection prior to activity can be used to external skin.  Rx: none awaiting labs  Follow with Affirm, GC & CHl  Will get STD's in am   Will get STAT HCG - coming back tomorrow for labs  Will return for AEX  RV prn

## 2016-12-31 ENCOUNTER — Emergency Department (HOSPITAL_COMMUNITY)
Admission: EM | Admit: 2016-12-31 | Discharge: 2016-12-31 | Disposition: A | Discharge status: Home / Self Care | Payer: BLUE CROSS/BLUE SHIELD | Attending: Emergency Medicine | Admitting: Emergency Medicine

## 2016-12-31 ENCOUNTER — Other Ambulatory Visit: Payer: BLUE CROSS/BLUE SHIELD

## 2016-12-31 ENCOUNTER — Telehealth: Payer: Self-pay

## 2016-12-31 ENCOUNTER — Encounter (HOSPITAL_COMMUNITY): Payer: Self-pay

## 2016-12-31 ENCOUNTER — Emergency Department (HOSPITAL_COMMUNITY): Payer: BLUE CROSS/BLUE SHIELD

## 2016-12-31 ENCOUNTER — Telehealth: Payer: Self-pay | Admitting: Nurse Practitioner

## 2016-12-31 DIAGNOSIS — R1011 Right upper quadrant pain: Secondary | ICD-10-CM | POA: Diagnosis not present

## 2016-12-31 DIAGNOSIS — N912 Amenorrhea, unspecified: Secondary | ICD-10-CM | POA: Diagnosis not present

## 2016-12-31 DIAGNOSIS — N760 Acute vaginitis: Secondary | ICD-10-CM | POA: Diagnosis not present

## 2016-12-31 LAB — COMPREHENSIVE METABOLIC PANEL
ALK PHOS: 60 U/L (ref 38–126)
ALT: 23 U/L (ref 14–54)
ANION GAP: 11 (ref 5–15)
AST: 28 U/L (ref 15–41)
Albumin: 4.3 g/dL (ref 3.5–5.0)
BILIRUBIN TOTAL: 0.5 mg/dL (ref 0.3–1.2)
BUN: 10 mg/dL (ref 6–20)
CALCIUM: 9.3 mg/dL (ref 8.9–10.3)
CO2: 20 mmol/L — ABNORMAL LOW (ref 22–32)
Chloride: 107 mmol/L (ref 101–111)
Creatinine, Ser: 0.78 mg/dL (ref 0.44–1.00)
Glucose, Bld: 122 mg/dL — ABNORMAL HIGH (ref 65–99)
POTASSIUM: 3.8 mmol/L (ref 3.5–5.1)
SODIUM: 138 mmol/L (ref 135–145)
TOTAL PROTEIN: 7.3 g/dL (ref 6.5–8.1)

## 2016-12-31 LAB — CBC
HEMATOCRIT: 42.7 % (ref 36.0–46.0)
HEMOGLOBIN: 14.2 g/dL (ref 12.0–15.0)
MCH: 26 pg (ref 26.0–34.0)
MCHC: 33.3 g/dL (ref 30.0–36.0)
MCV: 78.2 fL (ref 78.0–100.0)
Platelets: 259 10*3/uL (ref 150–400)
RBC: 5.46 MIL/uL — ABNORMAL HIGH (ref 3.87–5.11)
RDW: 15 % (ref 11.5–15.5)
WBC: 13 10*3/uL — ABNORMAL HIGH (ref 4.0–10.5)

## 2016-12-31 LAB — WET PREP BY MOLECULAR PROBE
CANDIDA SPECIES: NOT DETECTED
GARDNERELLA VAGINALIS: DETECTED — AB
Trichomonas vaginosis: NOT DETECTED

## 2016-12-31 LAB — URINALYSIS, ROUTINE W REFLEX MICROSCOPIC
BILIRUBIN URINE: NEGATIVE
Glucose, UA: NEGATIVE mg/dL
HGB URINE DIPSTICK: NEGATIVE
Ketones, ur: NEGATIVE mg/dL
NITRITE: NEGATIVE
PH: 5 (ref 5.0–8.0)
Protein, ur: NEGATIVE mg/dL
SPECIFIC GRAVITY, URINE: 1.02 (ref 1.005–1.030)

## 2016-12-31 LAB — GC/CHLAMYDIA PROBE AMP
CT Probe RNA: NOT DETECTED
GC Probe RNA: NOT DETECTED

## 2016-12-31 LAB — POC URINE PREG, ED: PREG TEST UR: NEGATIVE

## 2016-12-31 LAB — HCG, SERUM, QUALITATIVE: Preg, Serum: NEGATIVE

## 2016-12-31 LAB — LIPASE, BLOOD: LIPASE: 33 U/L (ref 11–51)

## 2016-12-31 MED ORDER — HYDROCODONE-ACETAMINOPHEN 5-325 MG PO TABS: 1.0000 | ORAL_TABLET | ORAL | 0 refills | Status: DC | PRN

## 2016-12-31 MED ORDER — FLUCONAZOLE 150 MG PO TABS: ORAL_TABLET | ORAL | 0 refills | Status: DC

## 2016-12-31 MED ORDER — CIPROFLOXACIN HCL 500 MG PO TABS: 500.0000 mg | ORAL_TABLET | Freq: Two times a day (BID) | ORAL | 0 refills | Status: DC

## 2016-12-31 MED ORDER — METRONIDAZOLE 500 MG PO TABS
500.0000 mg | ORAL_TABLET | Freq: Once | ORAL | Status: DC
  Filled 2016-12-31: qty 1

## 2016-12-31 MED ORDER — SODIUM CHLORIDE 0.9 % IV BOLUS (SEPSIS): 1000.0000 mL | Freq: Once | INTRAVENOUS | Status: DC

## 2016-12-31 MED ORDER — METRONIDAZOLE 500 MG PO TABS: 500.0000 mg | ORAL_TABLET | Freq: Three times a day (TID) | ORAL | 0 refills | Status: DC

## 2016-12-31 MED ORDER — ONDANSETRON 4 MG PO TBDP: 4.0000 mg | ORAL_TABLET | Freq: Three times a day (TID) | ORAL | 0 refills | Status: DC | PRN

## 2016-12-31 MED ORDER — CIPROFLOXACIN HCL 500 MG PO TABS
500.0000 mg | ORAL_TABLET | Freq: Once | ORAL | Status: DC
  Filled 2016-12-31: qty 1

## 2016-12-31 MED ORDER — FENTANYL CITRATE (PF) 100 MCG/2ML IJ SOLN
50.0000 ug | INTRAMUSCULAR | Status: DC | PRN
  Administered 2016-12-31: 50 ug via NASAL
  Filled 2016-12-31: qty 2

## 2016-12-31 MED ORDER — ONDANSETRON 4 MG PO TBDP
4.0000 mg | ORAL_TABLET | Freq: Once | ORAL | Status: AC | PRN
  Administered 2016-12-31: 4 mg via ORAL
  Filled 2016-12-31: qty 1

## 2016-12-31 NOTE — ED Notes (Signed)
Pt is requesting to hold off on starting an IV and administering NS, unless absolutely necessary. PA in the room speaking with pt, and is aware of pt's wishes.

## 2016-12-31 NOTE — ED Notes (Signed)
Sent down CBC recollection to lab

## 2016-12-31 NOTE — ED Triage Notes (Addendum)
Pt reports worsening 10/10 abd pain w/ n/v. Pt denies diarrhea. Pt denies vaginal d/c. Pt A+OX4, speaking in complete sentences, ambulatory to triage.

## 2016-12-31 NOTE — ED Provider Notes (Signed)
[facility]-EMERGENCY DEPT Provider Note   CSN: [id number] Arrival date & time: 12/30/16  2336     History   Chief Complaint Chief Complaint  Patient presents with  . Abdominal Pain    HPI Robin Burke is a 36 y.o. female who presents with abdominal pain. PMH significant for gallstones. She states that she has had intermittent epigastric/RUQ pain for several months. She had a RUQ [place] in March which showed gallstones without evidence of cholecystis. She has been trying to control her symptoms with diet change but notes that her symptoms have been more frequent and pain has become more severe. She reports associated nausea and vomiting. She denies fever, chills, chest pain, SOB, diarrhea, dysuria, vaginal discharge. She sees Dr. Derrell Lolling and is tenatively scheduled for lap chole on 6/12. He advised her to come to the ED if the pain became extremely severe.  HPI  Past Medical History:  Diagnosis Date  . 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 STD (female) 2010   HSV I & II  by serology    Patient Active Problem List   Diagnosis Date Noted  . Vitamin D deficiency 10/28/2016  . Chronic fatigue 12/31/2013  . Thyroid nodule 12/31/2013    Past Surgical History:  Procedure Laterality Date  . WISDOM TOOTH EXTRACTION  2007    OB History    Gravida Para Term Preterm AB Living   0 0 0 0 0 0   SAB TAB Ectopic Multiple Live Births   0 0 0 0 0       Home Medications    Prior to Admission medications   Not on File    Family History Family History  Problem Relation Age of Onset  . Diabetes Mother   . Hyperlipidemia Mother   . Coronary artery disease Father        Had 1 stent placed  . Thyroid cancer Maternal Grandmother   . Osteoarthritis Maternal Grandmother   . Diabetes Brother     Social History Social History  Substance Use Topics  . Smoking status: Former Smoker    Packs/day: 0.25    Years: 10.00    Types: Cigarettes    Quit date: 09/13/2011  . Smokeless  tobacco: Never Used  . Alcohol use 1.2 - 1.8 oz/week    2 - 3 Glasses of wine per week     Allergies   Patient has no known allergies.   Review of Systems Review of Systems  Constitutional: Negative for chills and fever.  Respiratory: Negative for shortness of breath.   Cardiovascular: Negative for chest pain.  Gastrointestinal: Positive for abdominal pain, nausea and vomiting. Negative for diarrhea.  Genitourinary: Negative for dysuria and vaginal discharge.  All other systems reviewed and are negative.    Physical Exam Updated Vital Signs BP 124/66   Pulse 62   Temp 98.2 F (36.8 C) (Oral)   Resp 18   Ht 5\' 1"  (1.549 m)   Wt 68 kg (150 lb)   LMP 11/26/2016   SpO2 100%   BMI 28.34 kg/m   Physical Exam  Constitutional: She is oriented to person, place, and time. She appears well-developed and well-nourished. No distress.  HENT:  Head: Normocephalic and atraumatic.  Eyes: Conjunctivae are normal. Pupils are equal, round, and reactive to light. Right eye exhibits no discharge. Left eye exhibits no discharge. No scleral icterus.  Neck: Normal range of motion.  Cardiovascular: Normal rate and regular rhythm.  Exam reveals  no gallop and no friction rub.   No murmur heard. Pulmonary/Chest: Effort normal and breath sounds normal. No respiratory distress. She has no wheezes. She has no rales. She exhibits no tenderness.  Abdominal: Soft. Bowel sounds are normal. She exhibits no distension and no mass. There is tenderness (mild RUQ and epigastric tenderness). There is no rebound and no guarding. No hernia.  Neurological: She is alert and oriented to person, place, and time.  Skin: Skin is warm and dry.  Psychiatric: She has a normal mood and affect. Her behavior is normal.  Nursing note and vitals reviewed.    ED Treatments / Results  Labs (all labs ordered are listed, but only abnormal results are displayed) Labs Reviewed  COMPREHENSIVE METABOLIC PANEL - Abnormal;  Notable for the following:       Result Value   CO2 20 (*)    Glucose, Bld 122 (*)    All other components within normal limits  URINALYSIS, ROUTINE W REFLEX MICROSCOPIC - Abnormal; Notable for the following:    APPearance HAZY (*)    Leukocytes, UA SMALL (*)    Bacteria, UA RARE (*)    Squamous Epithelial / LPF 0-5 (*)    All other components within normal limits  CBC - Abnormal; Notable for the following:    WBC 13.0 (*)    RBC 5.46 (*)    All other components within normal limits  LIPASE, BLOOD  POC URINE PREG, ED    EKG  EKG Interpretation None       Radiology Kore[place] Abdomen Limited Ruq  Result Date: 12/31/2016 CLINICAL DATA:  Right upper quadrant pain EXAM: US ABDOMEN LIMITED - RIGHT UPPER QUADRANT COMPARISON:  11/07/2016 FINDINGS: Gallbladder: Stones and sludge in the gallbladder, progressing since prior study. Largest stone measures about 8 mm diameter. Diffuse gallbladder wall thickening without significant edema. Murphy's sign is negative. Patient is on pain medication, rendering Murphy's sign non sensitive. Common bile duct: Diameter: 3.3 mm, normal Liver: No focal lesion identified. Within normal limits in parenchymal echogenicity. IMPRESSION: Stones and sludge in the gallbladder with diffuse gallbladder wall thickening. There is progression since previous study. Changes may represent cholecystitis in the appropriate clinical setting. Electronically Signed   By: Burman NievesWilliam  Stevens M.D.   On: 12/31/2016 04:29    Procedures Procedures (including critical care time)  Medications Ordered in ED Medications  fentaNYL (SUBLIMAZE) injection 50 mcg (50 mcg Nasal Given 12/31/16 0029)  sodium chloride 0.9 % bolus 1,000 mL (not administered)  ciprofloxacin (CIPRO) tablet 500 mg (not administered)  metroNIDAZOLE (FLAGYL) tablet 500 mg (not administered)  ondansetron (ZOFRAN-ODT) disintegrating tablet 4 mg (4 mg Oral Given 12/31/16 0029)     Initial Impression / Assessment and Plan  / ED Course  I have reviewed the triage vital signs and the nursing notes.  Pertinent labs & imaging results that were available during my care of the patient were reviewed by me and considered in my medical decision making (see chart for details).  36 year old female with RUQ pain consistent with biliary colic. BP is on the low side but otherwise vitals are normal. She was given pain medicine and Zofran on arrival and on exam is minimally tender. CBC remarkable for leukocytosis of 13.0. CMP and lipase are unremarkable. UA clean. Preg test negative. RUQ was repeated which shows stones and sludge in the gallbladder with diffuse gallbladder wall thickening.   On recheck, pt states she would like to go home. I spoke with Dr. Luisa Hartornett  who does not think pt has to stay if pain is controlled. Will give her antibiotics, pain medicine, nausea medicine to go home with. Advised call Dr. Jacinto Halim office today. Return precautions given.  Final Clinical Impressions(s) / ED Diagnoses   Final diagnoses:  RUQ pain    New Prescriptions New Prescriptions   No medications on file     Beryle Quant 12/31/16 0981    Palumbo, April, MD 12/31/16 [phone]

## 2016-12-31 NOTE — ED Notes (Signed)
Lab called and said the CBC was clotted and needs to be redrawn

## 2016-12-31 NOTE — Telephone Encounter (Signed)
-----   Message from Ria CommentPatricia Grubb, FNP sent at 12/31/2016  7:48 AM EDT ----- Please let pt know that Affirm shows BV.  She can have Metrogel HS X 5.  Then follow with Diflucan for yeast if that occurs X 2 doses. Rest of labs are pending.

## 2016-12-31 NOTE — Telephone Encounter (Signed)
Pt is called about lab test results.  The GC/ Chlamydia is negative.  Rest of the STD panel is not back.  The Affirm is positive for BV.  The STAT HCG is negative.  She may now take the Flagyl and Cipro that was given to last pm at ED.  She has been given Diflucan as back up if she gets yeast.  She had a flare on GB pain and is to have surgery on Friday.  She has on hand Zofran and Vicodan for GB pain.    Will follow up as needed.

## 2016-12-31 NOTE — Discharge Instructions (Signed)
Take antibiotics for the next 5 days Take pain medicine as needed Take nausea medicine as needed Call Dr. Jacinto Halimamirez's office today! Return for worsening symptoms

## 2016-12-31 NOTE — Telephone Encounter (Signed)
Left message for patient to reschedule lab appointment. °

## 2016-12-31 NOTE — Telephone Encounter (Signed)
Diflucan sent in to pharmacy on file. Patient notified.

## 2017-01-01 ENCOUNTER — Telehealth: Payer: Self-pay | Admitting: Family Medicine

## 2017-01-01 LAB — STD PANEL
HIV: NONREACTIVE
Hepatitis B Surface Ag: NEGATIVE

## 2017-01-01 NOTE — Telephone Encounter (Signed)
° ° ° °  Pt call to say she would like to speak with Dr SwazilandJordan. She opted out of having gall bladder surgery and she has some questions she want to discuss     [phone]

## 2017-01-02 NOTE — Telephone Encounter (Signed)
Message copied & pasted to patient in mychart message since Dr. SwazilandJordan is unable to call.

## 2017-01-02 NOTE — Telephone Encounter (Signed)
At this point I am not sure how much help I can provide for this particular problem (cholelythiasis). If she is not symptomatic she could hold on surgery but if she has another episode I recommend re-considering surgical options. Avoid fatty and heavy meals may help to prevent episodes. If she has further questions appt can be arranged.  Thanks, BJ

## 2017-01-07 DIAGNOSIS — K819 Cholecystitis, unspecified: Secondary | ICD-10-CM | POA: Diagnosis not present

## 2017-01-07 DIAGNOSIS — R112 Nausea with vomiting, unspecified: Secondary | ICD-10-CM | POA: Diagnosis not present

## 2017-01-13 NOTE — Pre-Procedure Instructions (Signed)
Robin Burke  01/13/2017      [id number] [address] - [place], [place] - [address] [place] [address] Phone: [phone] Fax: [phone]  [id number] - , [place] - [address] [place] [address] Phone: [phone] Fax: [phone]    Your procedure is scheduled on Wednesday June 13.  [facility] at 6:30 A.M.  Call this number if you have problems the morning of surgery:  [phone]   Remember:  Do not eat food or drink liquids after midnight.  Take these medicines the morning of surgery with A SIP OF WATER: NONE  7 days prior to surgery STOP taking any Aspirin, Aleve, Naproxen, Ibuprofen, Motrin, Advil, Goody's, BC's, all herbal medications, fish oil, and all vitamins    Do not wear jewelry, make-up or nail polish.  Do not wear lotions, powders, or perfumes, or deoderant.  Do not shave 48 hours prior to surgery.  Men may shave face and neck.  Do not bring valuables to the hospital.  [facility] is not responsible for any belongings or valuables.  Contacts, dentures or bridgework may not be worn into surgery.  Leave your suitcase in the car.  After surgery it may be brought to your room.  For patients admitted to the hospital, discharge time will be determined by your treatment team.  Patients discharged the day of surgery will not be allowed to drive home.    Special instructions:    [facility]- Preparing For Surgery  Before surgery, you can play an important role. Because skin is not sterile, your skin needs to be as free of germs as possible. You can reduce the number of germs on your skin by washing with CHG (chlorahexidine gluconate) Soap before surgery.  CHG is an antiseptic cleaner which kills germs and bonds with the skin to continue killing germs even after washing.  Please do not use if you have an allergy  to CHG or antibacterial soaps. If your skin becomes reddened/irritated stop using the CHG.  Do not shave (including legs and underarms) for at least 48 hours prior to first CHG shower. It is OK to shave your face.  Please follow these instructions carefully.   1. Shower the NIGHT BEFORE SURGERY and the MORNING OF SURGERY with CHG.   2. If you chose to wash your hair, wash your hair first as usual with your normal shampoo.  3. After you shampoo, rinse your hair and body thoroughly to remove the shampoo.  4. Use CHG as you would any other liquid soap. You can apply CHG directly to the skin and wash gently with a scrungie or a clean washcloth.   5. Apply the CHG Soap to your body ONLY FROM THE NECK DOWN.  Do not use on open wounds or open sores. Avoid contact with your eyes, ears, mouth and genitals (private parts). Wash genitals (private parts) with your normal soap.  6. Wash thoroughly, paying special attention to the area where your surgery will be performed.  7. Thoroughly rinse your body with warm water from the neck down.  8. DO NOT shower/wash with your normal soap after using and rinsing off the CHG Soap.  9. Pat yourself dry with a CLEAN TOWEL.   10. Wear CLEAN PAJAMAS   11. Place CLEAN SHEETS on your bed the night of your first shower and DO NOT SLEEP WITH PETS.    Day of  Surgery: Do not apply any deodorants/lotions. Please wear clean clothes to the hospital/surgery center.

## 2017-01-14 ENCOUNTER — Encounter (HOSPITAL_COMMUNITY): Payer: Self-pay

## 2017-01-14 ENCOUNTER — Encounter (HOSPITAL_COMMUNITY)
Admission: RE | Admit: 2017-01-14 | Discharge: 2017-01-14 | Disposition: A | Discharge status: Home / Self Care | Payer: BLUE CROSS/BLUE SHIELD | Source: Ambulatory Visit | Attending: General Surgery | Admitting: General Surgery

## 2017-01-14 DIAGNOSIS — K8020 Calculus of gallbladder without cholecystitis without obstruction: Secondary | ICD-10-CM | POA: Diagnosis not present

## 2017-01-14 DIAGNOSIS — Z01812 Encounter for preprocedural laboratory examination: Secondary | ICD-10-CM | POA: Insufficient documentation

## 2017-01-14 HISTORY — DX: Nontoxic single thyroid nodule: E04.1

## 2017-01-14 LAB — TYPE AND SCREEN
ABO/RH(D): O NEG
Antibody Screen: NEGATIVE

## 2017-01-14 LAB — CBC
HCT: 41.5 % (ref 36.0–46.0)
Hemoglobin: 13.2 g/dL (ref 12.0–15.0)
MCH: 25.8 pg — AB (ref 26.0–34.0)
MCHC: 31.8 g/dL (ref 30.0–36.0)
MCV: 81.2 fL (ref 78.0–100.0)
Platelets: 238 10*3/uL (ref 150–400)
RBC: 5.11 MIL/uL (ref 3.87–5.11)
RDW: 14.6 % (ref 11.5–15.5)
WBC: 5.4 10*3/uL (ref 4.0–10.5)

## 2017-01-14 LAB — ABO/RH: ABO/RH(D): O NEG

## 2017-01-14 LAB — HCG, SERUM, QUALITATIVE: Preg, Serum: NEGATIVE

## 2017-01-14 MED ORDER — CHLORHEXIDINE GLUCONATE CLOTH 2 % EX PADS: 6.0000 | MEDICATED_PAD | Freq: Once | CUTANEOUS | Status: DC

## 2017-01-14 NOTE — Progress Notes (Signed)
PCP - Betty SwazilandJordan Pt denies cardiac hx  Patient denies shortness of breath, fever, cough and chest pain at PAT appointment  Patient verbalized understanding of instructions that were given to them at the PAT appointment. Patient was also instructed that they will need to review over the PAT instructions again at home before surgery.

## 2017-01-22 ENCOUNTER — Ambulatory Visit (HOSPITAL_COMMUNITY)
Admission: RE | Admit: 2017-01-22 | Discharge: 2017-01-22 | Disposition: A | Discharge status: Home / Self Care | Payer: BLUE CROSS/BLUE SHIELD | Source: Ambulatory Visit | Attending: General Surgery | Admitting: General Surgery

## 2017-01-22 ENCOUNTER — Encounter: Payer: Self-pay | Admitting: Nurse Practitioner

## 2017-01-22 ENCOUNTER — Telehealth: Payer: Self-pay

## 2017-01-22 ENCOUNTER — Ambulatory Visit (HOSPITAL_COMMUNITY): Payer: BLUE CROSS/BLUE SHIELD | Admitting: Certified Registered"

## 2017-01-22 ENCOUNTER — Encounter (HOSPITAL_COMMUNITY)
Admission: RE | Disposition: A | Discharge status: Home / Self Care | Payer: Self-pay | Source: Ambulatory Visit | Attending: General Surgery

## 2017-01-22 ENCOUNTER — Encounter (HOSPITAL_COMMUNITY): Payer: Self-pay | Admitting: *Deleted

## 2017-01-22 DIAGNOSIS — K824 Cholesterolosis of gallbladder: Secondary | ICD-10-CM | POA: Diagnosis not present

## 2017-01-22 DIAGNOSIS — E041 Nontoxic single thyroid nodule: Secondary | ICD-10-CM | POA: Diagnosis not present

## 2017-01-22 DIAGNOSIS — K8020 Calculus of gallbladder without cholecystitis without obstruction: Secondary | ICD-10-CM | POA: Diagnosis not present

## 2017-01-22 DIAGNOSIS — Z87891 Personal history of nicotine dependence: Secondary | ICD-10-CM | POA: Diagnosis not present

## 2017-01-22 DIAGNOSIS — E559 Vitamin D deficiency, unspecified: Secondary | ICD-10-CM | POA: Diagnosis not present

## 2017-01-22 DIAGNOSIS — K8010 Calculus of gallbladder with chronic cholecystitis without obstruction: Secondary | ICD-10-CM | POA: Diagnosis not present

## 2017-01-22 DIAGNOSIS — K8080 Other cholelithiasis without obstruction: Secondary | ICD-10-CM | POA: Diagnosis present

## 2017-01-22 HISTORY — PX: CHOLECYSTECTOMY: SHX55

## 2017-01-22 SURGERY — LAPAROSCOPIC CHOLECYSTECTOMY
Anesthesia: General | Site: Abdomen

## 2017-01-22 MED ORDER — DEXAMETHASONE SODIUM PHOSPHATE 10 MG/ML IJ SOLN
INTRAMUSCULAR | Status: AC
Start: 2017-01-22 — End: ?
  Filled 2017-01-22: qty 1

## 2017-01-22 MED ORDER — KETOROLAC TROMETHAMINE 30 MG/ML IJ SOLN
INTRAMUSCULAR | Status: DC | PRN
  Administered 2017-01-22: 30 mg via INTRAVENOUS

## 2017-01-22 MED ORDER — SODIUM CHLORIDE 0.9% FLUSH: 3.0000 mL | Freq: Two times a day (BID) | INTRAVENOUS | Status: DC

## 2017-01-22 MED ORDER — ACETAMINOPHEN 650 MG RE SUPP: 650.0000 mg | RECTAL | Status: DC | PRN

## 2017-01-22 MED ORDER — FENTANYL CITRATE (PF) 250 MCG/5ML IJ SOLN
INTRAMUSCULAR | Status: DC | PRN
  Administered 2017-01-22 (×2): 50 ug via INTRAVENOUS
  Administered 2017-01-22: 100 ug via INTRAVENOUS

## 2017-01-22 MED ORDER — FENTANYL CITRATE (PF) 250 MCG/5ML IJ SOLN
INTRAMUSCULAR | Status: AC
  Filled 2017-01-22: qty 5

## 2017-01-22 MED ORDER — DEXAMETHASONE SODIUM PHOSPHATE 10 MG/ML IJ SOLN
INTRAMUSCULAR | Status: DC | PRN
Start: 2017-01-22 — End: 2017-01-22
  Administered 2017-01-22: 10 mg via INTRAVENOUS

## 2017-01-22 MED ORDER — ONDANSETRON HCL 4 MG/2ML IJ SOLN
INTRAMUSCULAR | Status: DC | PRN
  Administered 2017-01-22: 4 mg via INTRAVENOUS

## 2017-01-22 MED ORDER — PROPOFOL 10 MG/ML IV BOLUS
INTRAVENOUS | Status: DC | PRN
  Administered 2017-01-22: 150 mg via INTRAVENOUS

## 2017-01-22 MED ORDER — PROMETHAZINE HCL 25 MG/ML IJ SOLN: 6.2500 mg | INTRAMUSCULAR | Status: DC | PRN

## 2017-01-22 MED ORDER — ACETAMINOPHEN 325 MG PO TABS: 650.0000 mg | ORAL_TABLET | ORAL | Status: DC | PRN

## 2017-01-22 MED ORDER — ROCURONIUM BROMIDE 10 MG/ML (PF) SYRINGE
PREFILLED_SYRINGE | INTRAVENOUS | Status: AC
Start: 2017-01-22 — End: ?
  Filled 2017-01-22: qty 5

## 2017-01-22 MED ORDER — MIDAZOLAM HCL 2 MG/2ML IJ SOLN
INTRAMUSCULAR | Status: AC
Start: 2017-01-22 — End: ?
  Filled 2017-01-22: qty 2

## 2017-01-22 MED ORDER — ONDANSETRON HCL 4 MG/2ML IJ SOLN
INTRAMUSCULAR | Status: AC
Start: 2017-01-22 — End: ?
  Filled 2017-01-22: qty 2

## 2017-01-22 MED ORDER — PROPOFOL 10 MG/ML IV BOLUS
INTRAVENOUS | Status: AC
  Filled 2017-01-22: qty 20

## 2017-01-22 MED ORDER — BUPIVACAINE HCL 0.25 % IJ SOLN
INTRAMUSCULAR | Status: DC | PRN
  Administered 2017-01-22: 5 mL

## 2017-01-22 MED ORDER — ROCURONIUM BROMIDE 10 MG/ML (PF) SYRINGE
PREFILLED_SYRINGE | INTRAVENOUS | Status: DC | PRN
  Administered 2017-01-22: 40 mg via INTRAVENOUS

## 2017-01-22 MED ORDER — LIDOCAINE 2% (20 MG/ML) 5 ML SYRINGE
INTRAMUSCULAR | Status: AC
Start: 2017-01-22 — End: ?
  Filled 2017-01-22: qty 5

## 2017-01-22 MED ORDER — OXYCODONE-ACETAMINOPHEN 7.5-325 MG PO TABS: 1.0000 | ORAL_TABLET | ORAL | 0 refills | Status: DC | PRN

## 2017-01-22 MED ORDER — SUCCINYLCHOLINE CHLORIDE 200 MG/10ML IV SOSY
PREFILLED_SYRINGE | INTRAVENOUS | Status: AC
  Filled 2017-01-22: qty 10

## 2017-01-22 MED ORDER — OXYCODONE HCL 5 MG PO TABS
5.0000 mg | ORAL_TABLET | ORAL | Status: DC | PRN
  Administered 2017-01-22: 5 mg via ORAL

## 2017-01-22 MED ORDER — LACTATED RINGERS IV SOLN
INTRAVENOUS | Status: DC | PRN
  Administered 2017-01-22: 08:00:00 via INTRAVENOUS

## 2017-01-22 MED ORDER — MORPHINE SULFATE (PF) 2 MG/ML IV SOLN: 2.0000 mg | INTRAVENOUS | Status: DC | PRN

## 2017-01-22 MED ORDER — HYDROMORPHONE HCL 1 MG/ML IJ SOLN
INTRAMUSCULAR | Status: AC
  Filled 2017-01-22: qty 0.5

## 2017-01-22 MED ORDER — SODIUM CHLORIDE 0.9 % IV SOLN: 250.0000 mL | INTRAVENOUS | Status: DC | PRN

## 2017-01-22 MED ORDER — MIDAZOLAM HCL 2 MG/2ML IJ SOLN
INTRAMUSCULAR | Status: DC | PRN
  Administered 2017-01-22 (×2): 1 mg via INTRAVENOUS

## 2017-01-22 MED ORDER — CEFAZOLIN SODIUM-DEXTROSE 2-4 GM/100ML-% IV SOLN
INTRAVENOUS | Status: AC
  Filled 2017-01-22: qty 100

## 2017-01-22 MED ORDER — HYDROMORPHONE HCL 1 MG/ML IJ SOLN
0.2500 mg | INTRAMUSCULAR | Status: DC | PRN
  Administered 2017-01-22 (×2): 0.5 mg via INTRAVENOUS

## 2017-01-22 MED ORDER — SODIUM CHLORIDE 0.9% FLUSH: 3.0000 mL | INTRAVENOUS | Status: DC | PRN

## 2017-01-22 MED ORDER — NEOSTIGMINE METHYLSULFATE 5 MG/5ML IV SOSY
PREFILLED_SYRINGE | INTRAVENOUS | Status: AC
  Filled 2017-01-22: qty 5

## 2017-01-22 MED ORDER — CEFAZOLIN SODIUM-DEXTROSE 2-4 GM/100ML-% IV SOLN
2.0000 g | INTRAVENOUS | Status: AC
  Administered 2017-01-22: 2 g via INTRAVENOUS

## 2017-01-22 MED ORDER — OXYCODONE HCL 5 MG PO TABS
ORAL_TABLET | ORAL | Status: AC
  Filled 2017-01-22: qty 1

## 2017-01-22 MED ORDER — EPHEDRINE 5 MG/ML INJ
INTRAVENOUS | Status: AC
  Filled 2017-01-22: qty 10

## 2017-01-22 MED ORDER — GLYCOPYRROLATE 0.2 MG/ML IJ SOLN
INTRAMUSCULAR | Status: DC | PRN
  Administered 2017-01-22: 0.6 mg via INTRAVENOUS

## 2017-01-22 MED ORDER — NEOSTIGMINE METHYLSULFATE 10 MG/10ML IV SOLN
INTRAVENOUS | Status: DC | PRN
  Administered 2017-01-22: 4 mg via INTRAVENOUS

## 2017-01-22 MED ORDER — 0.9 % SODIUM CHLORIDE (POUR BTL) OPTIME
TOPICAL | Status: DC | PRN
  Administered 2017-01-22: 1000 mL

## 2017-01-22 MED ORDER — KETOROLAC TROMETHAMINE 30 MG/ML IJ SOLN: 30.0000 mg | Freq: Once | INTRAMUSCULAR | Status: DC | PRN

## 2017-01-22 MED ORDER — LIDOCAINE 2% (20 MG/ML) 5 ML SYRINGE
INTRAMUSCULAR | Status: DC | PRN
  Administered 2017-01-22: 60 mg via INTRAVENOUS

## 2017-01-22 MED ORDER — BUPIVACAINE HCL (PF) 0.25 % IJ SOLN
INTRAMUSCULAR | Status: AC
  Filled 2017-01-22: qty 30

## 2017-01-22 MED ORDER — SODIUM CHLORIDE 0.9 % IR SOLN
Status: DC | PRN
  Administered 2017-01-22: 1000 mL

## 2017-01-22 MED ORDER — PHENYLEPHRINE 40 MCG/ML (10ML) SYRINGE FOR IV PUSH (FOR BLOOD PRESSURE SUPPORT)
PREFILLED_SYRINGE | INTRAVENOUS | Status: AC
  Filled 2017-01-22: qty 10

## 2017-01-22 SURGICAL SUPPLY — 38 items
BENZOIN TINCTURE PRP APPL 2/3 (GAUZE/BANDAGES/DRESSINGS) ×2 IMPLANT
CANISTER SUCT 3000ML PPV (MISCELLANEOUS) ×2 IMPLANT
CHLORAPREP W/TINT 26ML (MISCELLANEOUS) ×2 IMPLANT
CLIP LIGATING HEMO O LOK GREEN (MISCELLANEOUS) ×4 IMPLANT
COVER SURGICAL LIGHT HANDLE (MISCELLANEOUS) ×2 IMPLANT
COVER TRANSDUCER ULTRASND (DRAPES) IMPLANT
DEVICE TROCAR PUNCTURE CLOSURE (ENDOMECHANICALS) ×2 IMPLANT
ELECT REM PT RETURN 9FT ADLT (ELECTROSURGICAL) ×2
ELECTRODE REM PT RTRN 9FT ADLT (ELECTROSURGICAL) ×1 IMPLANT
GAUZE SPONGE 2X2 8PLY STRL LF (GAUZE/BANDAGES/DRESSINGS) ×1 IMPLANT
GLOVE BIO SURGEON STRL SZ7.5 (GLOVE) ×2 IMPLANT
GLOVE BIOGEL PI IND STRL 8 (GLOVE) ×1 IMPLANT
GLOVE BIOGEL PI INDICATOR 8 (GLOVE) ×1
GOWN STRL REUS W/ TWL LRG LVL3 (GOWN DISPOSABLE) ×1 IMPLANT
GOWN STRL REUS W/ TWL XL LVL3 (GOWN DISPOSABLE) ×2 IMPLANT
GOWN STRL REUS W/TWL LRG LVL3 (GOWN DISPOSABLE) ×1
GOWN STRL REUS W/TWL XL LVL3 (GOWN DISPOSABLE) ×2
KIT BASIN OR (CUSTOM PROCEDURE TRAY) ×2 IMPLANT
KIT ROOM TURNOVER OR (KITS) ×2 IMPLANT
NEEDLE INSUFFLATION 14GA 120MM (NEEDLE) ×2 IMPLANT
NS IRRIG 1000ML POUR BTL (IV SOLUTION) ×2 IMPLANT
PAD ARMBOARD 7.5X6 YLW CONV (MISCELLANEOUS) ×4 IMPLANT
POUCH RETRIEVAL ECOSAC 10 (ENDOMECHANICALS) ×1 IMPLANT
POUCH RETRIEVAL ECOSAC 10MM (ENDOMECHANICALS) ×1
SCISSORS LAP 5X35 DISP (ENDOMECHANICALS) ×2 IMPLANT
SET IRRIG TUBING LAPAROSCOPIC (IRRIGATION / IRRIGATOR) ×2 IMPLANT
SLEEVE ENDOPATH XCEL 5M (ENDOMECHANICALS) ×4 IMPLANT
SPECIMEN JAR SMALL (MISCELLANEOUS) ×2 IMPLANT
SPONGE GAUZE 2X2 STER 10/PKG (GAUZE/BANDAGES/DRESSINGS) ×1
STRIP CLOSURE SKIN 1/2X4 (GAUZE/BANDAGES/DRESSINGS) ×2 IMPLANT
SUT MNCRL AB 3-0 PS2 18 (SUTURE) ×2 IMPLANT
SUT VICRYL 0 UR6 27IN ABS (SUTURE) ×2 IMPLANT
TAPE CLOTH SURG 4X10 WHT LF (GAUZE/BANDAGES/DRESSINGS) ×2 IMPLANT
TOWEL OR 17X24 6PK STRL BLUE (TOWEL DISPOSABLE) ×2 IMPLANT
TRAY LAPAROSCOPIC MC (CUSTOM PROCEDURE TRAY) ×2 IMPLANT
TROCAR XCEL NON-BLD 11X100MML (ENDOMECHANICALS) ×2 IMPLANT
TROCAR XCEL NON-BLD 5MMX100MML (ENDOMECHANICALS) ×2 IMPLANT
TUBING INSUFFLATION (TUBING) ×2 IMPLANT

## 2017-01-22 NOTE — Telephone Encounter (Signed)
Telephone encounter created to review with Patricia Grubb, FNP. 

## 2017-01-22 NOTE — Op Note (Signed)
01/22/2017  9:19 AM  PATIENT:  Kaeleen Scotto  36 y.o. female  PRE-OPERATIVE DIAGNOSIS:  Gallstones  POST-OPERATIVE DIAGNOSIS:  Gallstones  PROCEDURE:  Procedure(s): LAPAROSCOPIC CHOLECYSTECTOMY (N/A)  SURGEON:  Surgeon(s) and Role:    * Axel Filleramirez, Suman Trivedi, MD - Primary  ANESTHESIA:   local and general  EBL:  Total I/O In: -  Out: 10 [Blood:10]  BLOOD ADMINISTERED:none  DRAINS: none   LOCAL MEDICATIONS USED:  BUPIVICAINE   SPECIMEN:  Source of Specimen:  gallbladder  DISPOSITION OF SPECIMEN:  PATHOLOGY  COUNTS:  YES  TOURNIQUET:  * No tourniquets in log *  DICTATION: .Dragon Dictation The patient was taken to the operating and placed in the supine position with bilateral SCDs in place. The patient was prepped and draped in the usual sterile fashion. A time out was called and all facts were verified. A pneumoperitoneum was obtained via A Veress needle technique to a pressure of 14mm of mercury.  A 5mm trochar was then placed in the right upper quadrant under visualization, and there were no injuries to any abdominal organs. A 11 mm port was then placed in the umbilical region after infiltrating with local anesthesia under direct visualization. A second and third epigastric port and right lower quadrant port placement under direct visualization, respectively. The liver was  large. The gallbladder was identified and retracted, the peritoneum was then sharply dissected from the gallbladder and this dissection was carried down to Calot's triangle. The gallbladder was identified and stripped away circumferentially and seen going into the gallbladder 360, the critical angle was obtained.  2 clips were placed proximally one distally and the cystic duct transected. The cystic artery was identified and 2 clips placed proximally and one distally and transected. We then proceeded to remove the gallbladder off the hepatic fossa with Bovie cautery. A retrieval bag was then placed in the  abdomen and gallbladder placed in the bag. The hepatic fossa was then reexamined and hemostasis was achieved with Bovie cautery and was excellent at the end of the case. The subhepatic fossa and perihepatic fossa was then irrigated until the effluent was clear.  The gallbladder and bag were removed from the abdominal cavity. The 11 mm trocar fascia was reapproximated with the Endo Close #1 Vicryl x1. The pneumoperitoneum was evacuated and all trochars removed under direct visulalization. The skin was then closed with 4-0 Monocryl and the skin dressed with Steri-Strips, gauze, and tape. The patient was awaken from general anesthesia and taken to the recovery room in stable condition.   PLAN OF CARE: Discharge to home after PACU  PATIENT DISPOSITION:  PACU - hemodynamically stable.   Delay start of Pharmacological VTE agent (>24hrs) due to surgical blood loss or risk of bleeding: not applicable

## 2017-01-22 NOTE — H&P (Signed)
History of Present Illness The patient is a 36 year old female who presents for evaluation of gall stones. The patient is a she's had several bouts of epigastric abdominal pain that sometimes reduced right upper quadrant/right back area. Patient states this usually follows a high fatty meal. She states that recently she underwent a high fatty diet in the pain has subsided. Patient states that usually when she has pain associated with some nausea and vomiting, reflux. She has stated that she has had some jaundice in the past as well as dark-colored urine.  Patient denies any fevers, chills, constipation, diarrhea. All other reviews systems are negative.  Patient on ultrasound which revealed: IMPRESSION: Gallstones without sonographic evidence of acute cholecystitis. Normal appearance of the liver and common bile duct.    Past Surgical History ( No pertinent past surgical history   Diagnostic Studies History Colonoscopy  never Mammogram  never Pap Smear  1-5 years ago  Allergies No Known Allergies 12/04/2016  Medication History  No Current Medications Medications Reconciled  Social History  Alcohol use  Occasional alcohol use. Caffeine use  Coffee. No drug use  Tobacco use  Former smoker.  Family History  Arthritis  Mother. Diabetes Mellitus  Brother. Heart Disease  Father. Heart disease in female family member before age 69  Hypertension  Father, Mother. Migraine Headache  Mother.  Pregnancy / Birth History  Age at menarche  12 years. Contraceptive History  Oral contraceptives. Gravida  0 Para  0 Regular periods   Other Problems Cholelithiasis  Hemorrhoids     Review of Systems  General Present- Chills and Fever. Not Present- Appetite Loss, Fatigue, Night Sweats, Weight Gain and Weight Loss. Skin Present- Jaundice. Not Present- Change in Wart/Mole, Dryness, Hives, New Lesions, Non-Healing Wounds, Rash and Ulcer. HEENT  Present- Oral Ulcers and Seasonal Allergies. Not Present- Earache, Hearing Loss, Hoarseness, Nose Bleed, Ringing in the Ears, Sinus Pain, Sore Throat, Visual Disturbances, Wears glasses/contact lenses and Yellow Eyes. Respiratory Present- Difficulty Breathing. Not Present- Bloody sputum, Chronic Cough, Snoring and Wheezing. Breast Not Present- Breast Mass, Breast Pain, Nipple Discharge and Skin Changes. Cardiovascular Present- Chest Pain and Difficulty Breathing Lying Down. Not Present- Leg Cramps, Palpitations, Rapid Heart Rate, Shortness of Breath and Swelling of Extremities. Gastrointestinal Present- Bloating, Excessive gas, Indigestion, Nausea and Vomiting. Not Present- Abdominal Pain, Bloody Stool, Change in Bowel Habits, Chronic diarrhea, Constipation, Difficulty Swallowing, Gets full quickly at meals, Hemorrhoids and Rectal Pain. Female Genitourinary Not Present- Frequency, Nocturia, Painful Urination, Pelvic Pain and Urgency. Musculoskeletal Present- Back Pain. Not Present- Joint Pain, Joint Stiffness, Muscle Pain, Muscle Weakness and Swelling of Extremities. Neurological Present- Headaches. Not Present- Decreased Memory, Fainting, Numbness, Seizures, Tingling, Tremor, Trouble walking and Weakness. Psychiatric Not Present- Anxiety, Bipolar, Change in Sleep Pattern, Depression, Fearful and Frequent crying. Endocrine Not Present- Cold Intolerance, Excessive Hunger, Hair Changes, Heat Intolerance, Hot flashes and New Diabetes. Hematology Not Present- Blood Thinners, Easy Bruising, Excessive bleeding, Gland problems, HIV and Persistent Infections. BP 109/65   Pulse (!) 56   Temp 98.6 F (37 C) (Oral)   Resp 18   Wt 68 kg (150 lb)   LMP 01/05/2017 (Approximate)   SpO2 100%   BMI 28.34 kg/m     Physical Exam  General Mental Status-Alert. General Appearance-Consistent with stated age. Hydration-Well hydrated. Voice-Normal.  Head and Neck Head-normocephalic, atraumatic  with no lesions or palpable masses.  Eye Eyeball - Bilateral-Extraocular movements intact. Sclera/Conjunctiva - Bilateral-No scleral icterus.  Chest and Lung Exam Chest  and lung exam reveals -quiet, even and easy respiratory effort with no use of accessory muscles. Inspection Chest Wall - Normal. Back - normal.  Cardiovascular Cardiovascular examination reveals -normal heart sounds, regular rate and rhythm with no murmurs.  Abdomen Inspection Normal Exam - No Hernias. Palpation/Percussion Normal exam - Soft, Non Tender, No Rebound tenderness, No Rigidity (guarding) and No hepatosplenomegaly. Auscultation Normal exam - Bowel sounds normal.  Neurologic Neurologic evaluation reveals -alert and oriented x 3 with no impairment of recent or remote memory. Mental Status-Normal.  Musculoskeletal Normal Exam - Left-Upper Extremity Strength Normal and Lower Extremity Strength Normal. Normal Exam - Right-Upper Extremity Strength Normal, Lower Extremity Weakness.    Assessment & Plan  SYMPTOMATIC CHOLELITHIASIS (K80.20) Impression: Patient is a 36 year old female with symptomatic cholelithiasis  1. We will proceed to the operating room for a laparoscopic cholecystectomy  2. Risks and benefits were discussed with the patient to generally include, but not limited to: infection, bleeding, possible need for post op ERCP, damage to the bile ducts, bile leak, and possible need for further surgery. Alternatives were offered and described. All questions were answered and the patient voiced understanding of the procedure and wishes to proceed at this point with a laparoscopic cholecystectomy

## 2017-01-22 NOTE — Anesthesia Postprocedure Evaluation (Signed)
Anesthesia Post Note  Patient: Robin Burke  Procedure(s) Performed: Procedure(s) (LRB): LAPAROSCOPIC CHOLECYSTECTOMY (N/A)     Patient location during evaluation: PACU Anesthesia Type: General Level of consciousness: awake and alert Pain management: pain level controlled Vital Signs Assessment: post-procedure vital signs reviewed and stable Respiratory status: spontaneous breathing, nonlabored ventilation, respiratory function stable and patient connected to nasal cannula oxygen Cardiovascular status: blood pressure returned to baseline and stable Postop Assessment: no signs of nausea or vomiting Anesthetic complications: no    Last Vitals:  Vitals:   01/22/17 1012 01/22/17 1013  BP: (!) 109/91 (!) 109/91  Pulse:  (!) 52  Resp:  17  Temp:      Last Pain:  Vitals:   01/22/17 1013  TempSrc:   PainSc: Asleep                 Luca Burston S

## 2017-01-22 NOTE — Anesthesia Procedure Notes (Signed)
Procedure Name: Intubation Date/Time: 01/22/2017 8:38 AM Performed by: Julieta Bellini Pre-anesthesia Checklist: Patient identified, Emergency Drugs available, Patient being monitored and Suction available Patient Re-evaluated:Patient Re-evaluated prior to inductionOxygen Delivery Method: Circle system utilized Preoxygenation: Pre-oxygenation with 100% oxygen Intubation Type: IV induction Ventilation: Mask ventilation without difficulty Laryngoscope Size: Mac and 3 Grade View: Grade I Tube type: Oral Tube size: 7.0 mm Number of attempts: 1 Airway Equipment and Method: Stylet Placement Confirmation: ETT inserted through vocal cords under direct vision,  positive ETCO2 and breath sounds checked- equal and bilateral Secured at: 23 cm Tube secured with: Tape Dental Injury: Teeth and Oropharynx as per pre-operative assessment

## 2017-01-22 NOTE — Discharge Instructions (Signed)
[facility] ______[facility] °LAPAROSCOPIC SURGERY: POST OP INSTRUCTIONS °Always review your discharge instruction sheet given to you by the facility where your surgery was performed. °IF YOU HAVE DISABILITY OR FAMILY LEAVE FORMS, YOU MUST BRING THEM TO THE OFFICE FOR PROCESSING.   °DO NOT GIVE THEM TO YOUR DOCTOR. ° °1. A prescription for pain medication may be given to you upon discharge.  Take your pain medication as prescribed, if needed.  If narcotic pain medicine is not needed, then you may take acetaminophen (Tylenol) or ibuprofen (Advil) as needed. °2. Take your usually prescribed medications unless otherwise directed. °3. If you need a refill on your pain medication, please contact your pharmacy.  They will contact our office to request authorization. Prescriptions will not be filled after 5pm or on week-ends. °4. You should follow a light diet the first few days after arrival home, such as soup and crackers, etc.  Be sure to include lots of fluids daily. °5. Most patients will experience some swelling and bruising in the area of the incisions.  Ice packs will help.  Swelling and bruising can take several days to resolve.  °6. It is common to experience some constipation if taking pain medication after surgery.  Increasing fluid intake and taking a stool softener (such as Colace) will usually help or prevent this problem from occurring.  A mild laxative (Milk of Magnesia or Miralax) should be taken according to package instructions if there are no bowel movements after 48 hours. °7. Unless discharge instructions indicate otherwise, you may remove your bandages 24-48 hours after surgery, and you may shower at that time.  You may have steri-strips (small skin tapes) in place directly over the incision.  These strips should be left on the skin for 7-10 days.  If your surgeon used skin glue on the incision, you may shower in 24 hours.  The glue will flake off over the next 2-3 weeks.  Any sutures or  staples will be removed at the office during your follow-up visit. °8. ACTIVITIES:  You may resume regular (light) daily activities beginning the next day--such as daily self-care, walking, climbing stairs--gradually increasing activities as tolerated.  You may have sexual intercourse when it is comfortable.  Refrain from any heavy lifting or straining until approved by your doctor. °a. You may drive when you are no longer taking prescription pain medication, you can comfortably wear a seatbelt, and you can safely maneuver your car and apply brakes. °b. RETURN TO WORK:  __________________________________________________________ °9. You should see your doctor in the office for a follow-up appointment approximately 2-3 weeks after your surgery.  Make sure that you call for this appointment within a day or two after you arrive home to insure a convenient appointment time. °10. OTHER INSTRUCTIONS: __________________________________________________________________________________________________________________________ __________________________________________________________________________________________________________________________ °WHEN TO CALL YOUR DOCTOR: °1. Fever over 101.0 °2. Inability to urinate °3. Continued bleeding from incision. °4. Increased pain, redness, or drainage from the incision. °5. Increasing abdominal pain ° °The clinic staff is available to answer your questions during regular business hours.  Please don’t hesitate to call and ask to speak to one of the nurses for clinical concerns.  If you have a medical emergency, go to the nearest emergency room or call 911.  A surgeon from [facility] is always on call at the hospital. °[address], [address], [place], [place]  [address] ? [address] [address], [place], [place]   [address] °[phone] ? [phone] ? FAX [phone] °Web site:   [email or web address] °

## 2017-01-22 NOTE — Telephone Encounter (Signed)
Non-Urgent Medical Question  Message [id number]  From Kismet Chasen To Ria CommentPatricia Grubb, FNP Sent 01/22/2017 3:10 PM  Hi Dr. Berneice GandyGrubb,   I had my procedure this morning- I am now sans gallbladder :-(  They said it went well, just very sore and getting some painful spasms.   On a separate note, they told me I had to stop all medication prior to surgery, however a couple days ago, it started to feel like I was getting a yeast infection or maybe even another bacterial infection. Same symptoms as previously- intense itching and swelling of labia (although swelling is not nearly as bad as before) The itching is more inside vagina than on labia. Discharge is white and a little on the creamy side.   I never took the fluconazole after the two antibiotics because I had no symptoms of yeast infection, until two days before my surgery.   What do you think I should do?   [name]   Responsible Party   Pool - [facility] No one has taken responsibility for this message.  No actions have been taken on this message.   Ria CommentPatricia Grubb, FNP okay for patient to take Diflucan at this time and return to office if symptoms persist?

## 2017-01-22 NOTE — Transfer of Care (Signed)
Immediate Anesthesia Transfer of Care Note  Patient: Robin Burke  Procedure(s) Performed: Procedure(s): LAPAROSCOPIC CHOLECYSTECTOMY (N/A)  Patient Location: PACU  Anesthesia Type:General  Level of Consciousness: drowsy and patient cooperative  Airway & Oxygen Therapy: Patient Spontanous Breathing  Post-op Assessment: Report given to RN, Post -op Vital signs reviewed and stable and Patient moving all extremities X 4  Post vital signs: Reviewed and stable  Last Vitals:  Vitals:   01/22/17 0710 01/22/17 0934  BP: 109/65   Pulse: (!) 56   Resp: 18   Temp: 37 C (P) 36.7 C    Last Pain:  Vitals:   01/22/17 0710  TempSrc: Oral         Complications: No apparent anesthesia complications

## 2017-01-22 NOTE — Telephone Encounter (Signed)
Please have pt to take the Diflucan 150 mg as directed.

## 2017-01-22 NOTE — Anesthesia Preprocedure Evaluation (Signed)
Anesthesia Evaluation  Patient identified by MRN, date of birth, ID band Patient awake    Reviewed: Allergy & Precautions, NPO status , Patient's Chart, lab work & pertinent test results  Airway Mallampati: II  TM Distance: >3 FB Neck ROM: Full    Dental no notable dental hx.    Pulmonary neg pulmonary ROS, former smoker,    Pulmonary exam normal breath sounds clear to auscultation       Cardiovascular negative cardio ROS Normal cardiovascular exam Rhythm:Regular Rate:Normal     Neuro/Psych negative neurological ROS  negative psych ROS   GI/Hepatic negative GI ROS, Neg liver ROS,   Endo/Other  negative endocrine ROS  Renal/GU negative Renal ROS  negative genitourinary   Musculoskeletal negative musculoskeletal ROS (+)   Abdominal   Peds negative pediatric ROS (+)  Hematology negative hematology ROS (+)   Anesthesia Other Findings   Reproductive/Obstetrics negative OB ROS                             Anesthesia Physical Anesthesia Plan  ASA: I  Anesthesia Plan: General   Post-op Pain Management:    Induction: Intravenous  PONV Risk Score and Plan: 2 and Ondansetron and Dexamethasone  Airway Management Planned: Oral ETT  Additional Equipment:   Intra-op Plan:   Post-operative Plan: Extubation in OR  Informed Consent: I have reviewed the patients History and Physical, chart, labs and discussed the procedure including the risks, benefits and alternatives for the proposed anesthesia with the patient or authorized representative who has indicated his/her understanding and acceptance.   Dental advisory given  Plan Discussed with: CRNA and Surgeon  Anesthesia Plan Comments:         Anesthesia Quick Evaluation

## 2017-01-23 ENCOUNTER — Encounter (HOSPITAL_COMMUNITY): Payer: Self-pay | Admitting: General Surgery

## 2017-01-23 NOTE — Telephone Encounter (Signed)
Left message to call Eileen Kangas at [phone]. 

## 2017-01-27 ENCOUNTER — Encounter: Payer: Self-pay | Admitting: Nurse Practitioner

## 2017-02-05 NOTE — Telephone Encounter (Signed)
Please see MyChart encounter dated 01/27/2017. Patient took second Diflucan and was advised to contact the office to schedule and OV if symptoms persist.  Routing to provider for final review. Patient agreeable to disposition. Will close encounter.

## 2017-03-12 ENCOUNTER — Ambulatory Visit: Payer: BLUE CROSS/BLUE SHIELD | Admitting: Certified Nurse Midwife

## 2017-03-12 NOTE — Progress Notes (Deleted)
36 y.o. G0P0000 Divorced  {Race/ethnicity:17218} Fe here for annual exam.    No LMP recorded.          Sexually active: {yes no:314532}  The current method of family planning is {contraception:315051}.    Exercising: {yes no:314532}  {types:19826} Smoker:  {YES NO:22349}  Health Maintenance: Pap:  06-13-15 neg HPV HR neg History of Abnormal Pap: {YES NO:22349} MMG:  none Self Breast exams: {YES NO:22349} Colonoscopy:  none BMD:   none TDaP:  *** Shingles: *** Pneumonia: *** Hep C and HIV: HIV neg 2018 Labs: ***   reports that she quit smoking about 5 years ago. Her smoking use included Cigarettes. She has a 2.50 pack-year smoking history. She has never used smokeless tobacco. She reports that she drinks about 1.2 - 1.8 oz of alcohol per week . She reports that she does not use drugs.  Past Medical History:  Diagnosis Date  . 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 STD (female) 2010   HSV I & II  by serology  . Thyroid nodule    "all of my tests have come back normal"     Past Surgical History:  Procedure Laterality Date  . CHOLECYSTECTOMY N/A 01/22/2017   Procedure: LAPAROSCOPIC CHOLECYSTECTOMY;  Surgeon: Axel Filleramirez, Armando, MD;  [facility];  Service: General;  Laterality: N/A;  . WISDOM TOOTH EXTRACTION  2007    Current Outpatient Prescriptions  Medication Sig Dispense Refill  . oxyCODONE-acetaminophen (PERCOCET) 7.5-325 MG tablet Take 1 tablet by mouth every 4 (four) hours as needed for severe pain. 20 tablet 0   No current facility-administered medications for this visit.     Family History  Problem Relation Age of Onset  . Diabetes Mother   . Hyperlipidemia Mother   . Coronary artery disease Father        Had 1 stent placed  . Thyroid cancer Maternal Grandmother   . Osteoarthritis Maternal Grandmother   . Diabetes Brother     ROS:  Pertinent items are noted in HPI.  Otherwise, a comprehensive ROS was negative.  Exam:   There were no vitals taken  for this visit.   Ht Readings from Last 3 Encounters:  01/22/17 5\' 1"  (1.549 m)  01/14/17 5\' 1"  (1.549 m)  12/30/16 5\' 1"  (1.549 m)    General appearance: alert, cooperative and appears stated age Head: Normocephalic, without obvious abnormality, atraumatic Neck: no adenopathy, supple, symmetrical, trachea midline and thyroid {EXAM; THYROID:18604} Lungs: clear to auscultation bilaterally Breasts: {Exam; breast:13139::"normal appearance, no masses or tenderness"} Heart: regular rate and rhythm Abdomen: soft, non-tender; no masses,  no organomegaly Extremities: extremities normal, atraumatic, no cyanosis or edema Skin: Skin color, texture, turgor normal. No rashes or lesions Lymph nodes: Cervical, supraclavicular, and axillary nodes normal. No abnormal inguinal nodes palpated Neurologic: Grossly normal   Pelvic: External genitalia:  no lesions              Urethra:  normal appearing urethra with no masses, tenderness or lesions              Bartholin's and Skene's: normal                 Vagina: normal appearing vagina with normal color and discharge, no lesions              Cervix: {exam; cervix:14595}              Pap taken: {yes no:314532} Bimanual Exam:  Uterus:  {  exam; uterus:12215}              Adnexa: {exam; adnexa:12223}               Rectovaginal: Confirms               Anus:  normal sphincter tone, no lesions  Chaperone present: ***  A:  Well Woman with normal exam  P:   Reviewed health and wellness pertinent to exam  Pap smear: {YES NO:22349}  {plan; gyn:5269::"mammogram","pap smear","return annually or prn"}  An After Visit Summary was printed and given to the patient.

## 2017-03-13 ENCOUNTER — Ambulatory Visit (INDEPENDENT_AMBULATORY_CARE_PROVIDER_SITE_OTHER): Payer: BLUE CROSS/BLUE SHIELD | Admitting: Certified Nurse Midwife

## 2017-03-13 ENCOUNTER — Other Ambulatory Visit (HOSPITAL_COMMUNITY)
Admission: RE | Admit: 2017-03-13 | Discharge: 2017-03-13 | Disposition: A | Discharge status: Home / Self Care | Payer: BLUE CROSS/BLUE SHIELD | Source: Ambulatory Visit | Attending: Obstetrics & Gynecology | Admitting: Obstetrics & Gynecology

## 2017-03-13 ENCOUNTER — Encounter: Payer: Self-pay | Admitting: Certified Nurse Midwife

## 2017-03-13 VITALS — BP 110/70 | HR 70 | Resp 16 | Ht 60.75 in | Wt 152.0 lb

## 2017-03-13 DIAGNOSIS — Z01419 Encounter for gynecological examination (general) (routine) without abnormal findings: Secondary | ICD-10-CM | POA: Diagnosis not present

## 2017-03-13 DIAGNOSIS — R300 Dysuria: Secondary | ICD-10-CM

## 2017-03-13 DIAGNOSIS — Z0000 Encounter for general adult medical examination without abnormal findings: Secondary | ICD-10-CM | POA: Diagnosis not present

## 2017-03-13 DIAGNOSIS — Z124 Encounter for screening for malignant neoplasm of cervix: Secondary | ICD-10-CM | POA: Diagnosis not present

## 2017-03-13 DIAGNOSIS — N3001 Acute cystitis with hematuria: Secondary | ICD-10-CM

## 2017-03-13 LAB — POCT URINALYSIS DIPSTICK
Bilirubin, UA: NEGATIVE
Glucose, UA: NEGATIVE
Ketones, UA: NEGATIVE
Nitrite, UA: NEGATIVE
Protein, UA: NEGATIVE
Urobilinogen, UA: NEGATIVE U/dL — AB
pH, UA: 5

## 2017-03-13 MED ORDER — SULFAMETHOXAZOLE-TRIMETHOPRIM 800-160 MG PO TABS: 1.0000 | ORAL_TABLET | Freq: Two times a day (BID) | ORAL | 0 refills | Status: DC

## 2017-03-13 NOTE — Patient Instructions (Signed)
EXERCISE AND DIET:  We recommended that you start or continue a regular exercise program for good health. Regular exercise means any activity that makes your heart beat faster and makes you sweat.  We recommend exercising at least 30 minutes per day at least 3 days a week, preferably 4 or 5.  We also recommend a diet low in fat and sugar.  Inactivity, poor dietary choices and obesity can cause diabetes, heart attack, stroke, and kidney damage, among others.    ALCOHOL AND SMOKING:  Women should limit their alcohol intake to no more than 7 drinks/beers/glasses of wine (combined, not each!) per week. Moderation of alcohol intake to this level decreases your risk of breast cancer and liver damage. And of course, no recreational drugs are part of a healthy lifestyle.  And absolutely no smoking or even second hand smoke. Most people know smoking can cause heart and lung diseases, but did you know it also contributes to weakening of your bones? Aging of your skin?  Yellowing of your teeth and nails?  CALCIUM AND VITAMIN D:  Adequate intake of calcium and Vitamin D are recommended.  The recommendations for exact amounts of these supplements seem to change often, but generally speaking 600 mg of calcium (either carbonate or citrate) and 800 units of Vitamin D per day seems prudent. Certain women may benefit from higher intake of Vitamin D.  If you are among these women, your doctor will have told you during your visit.    PAP SMEARS:  Pap smears, to check for cervical cancer or precancers,  have traditionally been done yearly, although recent scientific advances have shown that most women can have pap smears less often.  However, every woman still should have a physical exam from her gynecologist every year. It will include a breast check, inspection of the vulva and vagina to check for abnormal growths or skin changes, a visual exam of the cervix, and then an exam to evaluate the size and shape of the uterus and  ovaries.  And after 35 years of age, a rectal exam is indicated to check for rectal cancers. We will also provide age appropriate advice regarding health maintenance, like when you should have certain vaccines, screening for sexually transmitted diseases, bone density testing, colonoscopy, mammograms, etc.   MAMMOGRAMS:  All women over 40 years old should have a yearly mammogram. Many facilities now offer a "3D" mammogram, which may cost around $50 extra out of pocket. If possible,  we recommend you accept the option to have the 3D mammogram performed.  It both reduces the number of women who will be called back for extra views which then turn out to be normal, and it is better than the routine mammogram at detecting truly abnormal areas.    COLONOSCOPY:  Colonoscopy to screen for colon cancer is recommended for all women at age 50.  We know, you hate the idea of the prep.  We agree, BUT, having colon cancer and not knowing it is worse!!  Colon cancer so often starts as a polyp that can be seen and removed at colonscopy, which can quite literally save your life!  And if your first colonoscopy is normal and you have no family history of colon cancer, most women don't have to have it again for 10 years.  Once every ten years, you can do something that may end up saving your life, right?  We will be happy to help you get it scheduled when you are ready.    Be sure to check your insurance coverage so you understand how much it will cost.  It may be covered as a preventative service at no cost, but you should check your particular policy.      Urinary Frequency, Adult Urinary frequency means urinating more often than usual. People with urinary frequency urinate at least 8 times in 24 hours, even if they drink a normal amount of fluid. Although they urinate more often than normal, the total amount of urine produced in a day may be normal. Urinary frequency is also called pollakiuria. What are the causes? This  condition may be caused by:  A urinary tract infection.  Obesity.  Bladder problems, such as bladder stones.  Caffeine or alcohol.  Eating food or drinking fluids that irritate the bladder. These include coffee, tea, soda, artificial sweeteners, citrus, tomato-based foods, and chocolate.  Certain medicines, such as medicines that help the body get rid of extra fluid (diuretics).  Muscle or nerve weakness.  Overactive bladder.  Chronic diabetes.  Interstitial cystitis.  In men, problems with the prostate, such as an enlarged prostate.  In women, pregnancy.  In some cases, the cause may not be known. What increases the risk? This condition is more likely to develop in:  Women who have gone through menopause.  Men with prostate problems.  People with a disease or injury that affects the nerves or spinal cord.  People who have or have had a condition that affects the brain, such as a stroke.  What are the signs or symptoms? Symptoms of this condition include:  Feeling an urgent need to urinate often. The stress and anxiety of needing to find a bathroom quickly can make this urge worse.  Urinating 8 or more times in 24 hours.  Urinating as often as every 1 to 2 hours.  How is this diagnosed? This condition is diagnosed based on your symptoms, your medical history, and a physical exam. You may have tests, such as:  Blood tests.  Urine tests.  Imaging tests, such as X-rays or ultrasounds.  A bladder test.  A test of your neurological system. This is the body system that senses the need to urinate.  A test to check for problems in the urethra and bladder called cystoscopy.  You may also be asked to keep a bladder diary. A bladder diary is a record of what you eat and drink, how often you urinate, and how much you urinate. You may need to see a health care provider who specializes in conditions of the urinary tract (urologist) or kidneys (nephrologist). How is  this treated? Treatment for this condition depends on the cause. Sometimes the condition goes away on its own and treatment is not necessary. If treatment is needed, it may include:  Taking medicine.  Learning exercises that strengthen the muscles that help control urination.  Following a bladder training program. This may include: ? Learning to delay going to the bathroom. ? Double urinating (voiding). This helps if you are not completely emptying your bladder. ? Scheduled voiding.  Making diet changes, such as: ? Avoiding caffeine. ? Drinking fewer fluids, especially alcohol. ? Not drinking in the evening. ? Not having foods or drinks that may irritate the bladder. ? Eating foods that help prevent or ease constipation. Constipation can make this condition worse.  Having the nerves in your bladder stimulated. There are two options for stimulating the nerves to your bladder: ? Outpatient electrical nerve stimulation. This is done by your health  care provider. ? Surgery to implant a bladder pacemaker. The pacemaker helps to control the urge to urinate.  Follow these instructions at home:  Keep a bladder diary if told to by your health care provider.  Take over-the-counter and prescription medicines only as told by your health care provider.  Do any exercises as told by your health care provider.  Follow a bladder training program as told by your health care provider.  Make any recommended diet changes.  Keep all follow-up visits as told by your health care provider. This is important. Contact a health care provider if:  You start urinating more often.  You feel pain or irritation when you urinate.  You notice blood in your urine.  Your urine looks cloudy.  You develop a fever.  You begin vomiting. Get help right away if:  You are unable to urinate. This information is not intended to replace advice given to you by your health care provider. Make sure you discuss any  questions you have with your health care provider. Document Released: 05/25/2009 Document Revised: 08/30/2015 Document Reviewed: 02/22/2015 [employer] Interactive Patient Education  [employer]2018 [employer]

## 2017-03-13 NOTE — Progress Notes (Signed)
36 y.o. G0P0000 Single 1/2 columbian & 1/2 egyptian Fe here for annual exam. Contraception condoms. Periods normal, no issues. Complaining of urinary frequency, urgency and dysuria for the past 3-4 days. Has increased water, but no change.  Denies fever or chills or back pain. No vaginal symptoms. ?UTI. No STD concerns but will be with new partner and would like HSV1,2 serum testing. No other health issues today.  Patient's last menstrual period was 03/02/2017 (exact date).          Sexually active: Yes.    The current method of family planning is condoms most of the time.    Exercising: Yes.    H.I.T & yoga Smoker:  no  Health Maintenance: Pap:  06-07-14 neg HPV HR+ 16/18 neg, 06-13-15 neg HPV HR neg History of Abnormal Pap: yes MMG:  none Self Breast exams: occ Colonoscopy:  none BMD:   none TDaP: 2006 Shingles: no Pneumonia: no Hep C and HIV: HIV neg 2018 Labs: poct urine-wbc 2+, rbc 2+   reports that she quit smoking about 5 years ago. Her smoking use included Cigarettes. She has a 2.50 pack-year smoking history. She has never used smokeless tobacco. She reports that she drinks about 1.2 - 1.8 oz of alcohol per week . She reports that she does not use drugs.  Past Medical History:  Diagnosis Date  . 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 STD (female) 2010   HSV I & II  by serology  . Thyroid nodule    "all of my tests have come back normal"     Past Surgical History:  Procedure Laterality Date  . CHOLECYSTECTOMY N/A 01/22/2017   Procedure: LAPAROSCOPIC CHOLECYSTECTOMY;  Surgeon: Axel Filleramirez, Armando, MD;  [facility];  Service: General;  Laterality: N/A;  . WISDOM TOOTH EXTRACTION  2007    No current outpatient prescriptions on file.   No current facility-administered medications for this visit.     Family History  Problem Relation Age of Onset  . Diabetes Mother   . Hyperlipidemia Mother   . Coronary artery disease Father        Had 1 stent placed  .  Thyroid cancer Maternal Grandmother   . Osteoarthritis Maternal Grandmother   . Diabetes Brother     ROS:  Pertinent items are noted in HPI.  Otherwise, a comprehensive ROS was negative.  Exam:   BP 110/70   Pulse 70   Resp 16   Ht 5' 0.75" (1.543 m)   Wt 152 lb (68.9 kg)   LMP 03/02/2017 (Exact Date)   BMI 28.96 kg/m  Height: 5' 0.75" (154.3 cm) Ht Readings from Last 3 Encounters:  03/13/17 5' 0.75" (1.543 m)  01/22/17 5\' 1"  (1.549 m)  01/14/17 5\' 1"  (1.549 m)    General appearance: alert, cooperative and appears stated age Head: Normocephalic, without obvious abnormality, atraumatic Neck: no adenopathy, supple, symmetrical, trachea midline and thyroid normal to inspection and palpation and nodule noted,no known followed by Endocrine Lungs: clear to auscultation bilaterally CVAT bilateral negative Breasts: normal appearance, no masses or tenderness, No nipple retraction or dimpling, No nipple discharge or bleeding, No axillary or supraclavicular adenopathy, Taught monthly breast self examination Heart: regular rate and rhythm Abdomen: soft, non-tender; no masses,  no organomegaly, positive suprapubic pain Extremities: extremities normal, atraumatic, no cyanosis or edema Skin: Skin color, texture, turgor normal. No rashes or lesions Lymph nodes: Cervical, supraclavicular, and axillary nodes normal. No abnormal inguinal nodes palpated Neurologic:  Grossly normal   Pelvic: External genitalia:  no lesions              Urethra:  normal appearing urethra with no masses, slightly tenderness or lesions  Bladder and urethral meatus tender              Bartholin's and Skene's: normal                 Vagina: normal appearing vagina with normal color and discharge, no lesions              Cervix: no cervical motion tenderness, no lesions and nulliparous appearance              Pap taken: Yes.   Bimanual Exam:  Uterus:  normal size, contour, position, consistency, mobility,  non-tender              Adnexa: normal adnexa and no mass, fullness, tenderness               Rectovaginal: Confirms               Anus:  normal sphincter tone, no lesions  Chaperone present: yes  A:  Well Woman with normal exam  Contraception condoms  UTI  STD screening  History of abnormal pap smear with + HPVHR, 16,18 negative, repeat pap today  P:   Reviewed health and wellness pertinent to exam  Discussed UTI finding and etiology. Discussed need to treat and also needs to increase water intake and limit soda and tea intake. Warning signs of UTI given  Rx Bactrim DS  Labs: urine Micro, culture, HSV 1,2  Pap smear: yes   counseled on breast self exam, STD prevention, adequate intake of calcium and vitamin D, diet and exercise  return annually or prn  An After Visit Summary was printed and given to the patient.

## 2017-03-14 LAB — HSV(HERPES SIMPLEX VRS) I + II AB-IGG
HSV 1 GLYCOPROTEIN G AB, IGG: 1.18 {index} — AB (ref 0.00–0.90)
HSV 2 Glycoprotein G Ab, IgG: 2.25 index — ABNORMAL HIGH (ref 0.00–0.90)

## 2017-03-14 LAB — URINALYSIS, MICROSCOPIC ONLY
CASTS: NONE SEEN /LPF
WBC, UA: >30 /hpf — AB (ref 0–?)

## 2017-03-16 LAB — URINE CULTURE

## 2017-03-17 LAB — CYTOLOGY - PAP
Adequacy: ABSENT
Diagnosis: NEGATIVE

## 2017-05-01 ENCOUNTER — Encounter: Payer: Self-pay | Admitting: Family Medicine

## 2017-05-12 ENCOUNTER — Telehealth: Payer: Self-pay | Admitting: Certified Nurse Midwife

## 2017-05-12 ENCOUNTER — Ambulatory Visit (INDEPENDENT_AMBULATORY_CARE_PROVIDER_SITE_OTHER): Payer: BLUE CROSS/BLUE SHIELD | Admitting: Family Medicine

## 2017-05-12 ENCOUNTER — Encounter: Payer: Self-pay | Admitting: Family Medicine

## 2017-05-12 VITALS — BP 126/80 | HR 84 | Resp 12 | Ht 60.75 in | Wt 155.1 lb

## 2017-05-12 DIAGNOSIS — R300 Dysuria: Secondary | ICD-10-CM

## 2017-05-12 DIAGNOSIS — N390 Urinary tract infection, site not specified: Secondary | ICD-10-CM

## 2017-05-12 LAB — POCT URINALYSIS DIPSTICK
BILIRUBIN UA: NEGATIVE
Blood, UA: POSITIVE
GLUCOSE UA: NEGATIVE
KETONES UA: POSITIVE
Nitrite, UA: NEGATIVE
Protein, UA: POSITIVE
Urobilinogen, UA: 0.2 E.U./dL
pH, UA: 5.5 (ref 5.0–8.0)

## 2017-05-12 MED ORDER — NITROFURANTOIN MONOHYD MACRO 100 MG PO CAPS: 100.0000 mg | ORAL_CAPSULE | Freq: Two times a day (BID) | ORAL | 0 refills | Status: AC

## 2017-05-12 NOTE — Telephone Encounter (Signed)
Patient called and requested some medication for a "UTI." She said she is leaving to go out of town in 24 hours and declined an appointment for today.

## 2017-05-12 NOTE — Telephone Encounter (Signed)
Spoke with patient. Patient states that she is having UTI symptoms. Advised she will need to be seen for further evaluation in office. Patient states she has received medication over the phone before for UTI treatment. Advised it is our office policy that she be seen in the office before a prescription is given to ensure she is getting the proper treatment. Patient will speak with her boss and return call.

## 2017-05-12 NOTE — Telephone Encounter (Signed)
Patient called back states she is out of town and wants to know if we are going to give her the medication.

## 2017-05-12 NOTE — Progress Notes (Signed)
HPI:   Robin Burke is a 36 y.o. female, who is here today complaining of 12 hours of urinary symptoms.   Dysuria: Yes. Urinary frequency: Yes, small amount of urina at the time. Urinary urgency: Denies Incontinence: Denies Gross ematuria: Denies  Abdominal pain: Denies  Nausea or vomiting: Denies  Abnormal vaginal bleeding or discharge: Denies She denies fever, chills.  Because her schedule she has not drinking as much fluids as she usually does. She is also holding urine for longer period of times.  LMP: 04/28/17 Sexual activity: Yes, increased frequency in the past few days. Hx of UTI: Yes, last one in 01/2017.  OTC medications for this problem: None   Review of Systems  Constitutional: Positive for fatigue. Negative for activity change, appetite change and fever.  Cardiovascular: Negative for leg swelling.  Gastrointestinal: Negative for abdominal pain, nausea and vomiting.  Genitourinary: Positive for dysuria and frequency. Negative for decreased urine volume, hematuria, pelvic pain, urgency, vaginal bleeding and vaginal discharge.  Musculoskeletal: Negative for back pain and myalgias.  Skin: Negative for pallor and rash.  Neurological: Negative for syncope, weakness and headaches.  Psychiatric/Behavioral: Negative for confusion. The patient is nervous/anxious.       No current outpatient prescriptions on file prior to visit.   No current facility-administered medications on file prior to visit.      Past Medical History:  Diagnosis Date  . 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 STD (female) 2010   HSV I & II  by serology  . Thyroid nodule    "all of my tests have come back normal"    Allergies  Allergen Reactions  . No Known Allergies     Social History   Social History  . Marital status: Single    Spouse name: N/A  . Number of children: 0  . Years of education: N/A   Occupational History  . Event planner     [employer]    Social History Main Topics  . Smoking status: Former Smoker    Packs/day: 0.25    Years: 10.00    Types: Cigarettes    Quit date: 09/13/2011  . Smokeless tobacco: Never Used  . Alcohol use 1.2 - 1.8 oz/week    2 - 3 Glasses of wine per week  . Drug use: No  . Sexual activity: Yes    Partners: Male    Birth control/ protection: Condom     Comment: some of the time   Other Topics Concern  . None   Social History Narrative   She moved to [place] one half years ago. She previously lived in [place] and in [place].   She is half [place] and half [place].   She works for [employer]    Vitals:   05/12/17 1552  BP: 126/80  Pulse: 84  Resp: 12   Body mass index is 29.55 kg/m.    Physical Exam  Nursing note and vitals reviewed. Constitutional: She is oriented to person, place, and time. She appears well-developed. No distress.  HENT:  Head: Normocephalic and atraumatic.  Mouth/Throat: Oropharynx is clear and moist and mucous membranes are normal.  Eyes: Conjunctivae are normal.  Cardiovascular: Normal rate and regular rhythm.   Respiratory: Effort normal and breath sounds normal. No respiratory distress.  GI: Soft. She exhibits no mass. There is tenderness in the suprapubic area. There is no rebound, no guarding and no CVA tenderness.  Musculoskeletal: She exhibits  no edema or tenderness.  Lymphadenopathy:    She has no cervical adenopathy.  Neurological: She is alert and oriented to person, place, and time. She has normal strength. Gait normal.  Skin: Skin is warm. No erythema.  Psychiatric: Her speech is normal. Her mood appears anxious.  Well groomed, good eye contact.     ASSESSMENT AND PLAN:   [name] was seen today for urinary tract infection.  Diagnoses and all orders for this visit:  Dysuria  Urine dipstick +1 leuk , 2+ blood, and ketones. Possible causes discussed.  -     POCT urinalysis dipstick -     Culture, Urine  Urinary  tract infection without hematuria, site unspecified -     nitrofurantoin, macrocrystal-monohydrate, (MACROBID) 100 MG capsule; Take 1 capsule (100 mg total) by mouth 2 (two) times daily. -     Culture, Urine  Ucx ordered.  Recommended increasing fluid intake and avoiding holding urine for long periods of times. Empiric abx treatment started today and will be tailored according to Ucx results and susceptibility report.  Clearly instructed about warning signs. F/U if symptoms persist.    -Robin Burke was advised to return or notify a doctor immediately if symptoms worsen or persist or new concerns arise.She voices understanding.       Betty G. [place], MD  [facility]. [place] office.

## 2017-05-12 NOTE — Patient Instructions (Addendum)
A few things to remember from today's visit:   UTI symptoms - Plan: POCT urinalysis dipstick, nitrofurantoin, macrocrystal-monohydrate, (MACROBID) 100 MG capsule  Urinary tract infection without hematuria, site unspecified   Please be sure medication list is accurate. If a new problem present, please set up appointment sooner than planned today.   Adequate fluid intake, avoid holding urine for long hours, and over the counter Vit C OR cranberry capsules might help.  Today we will treat empirically with antibiotic, which we might need to change when urine culture comes back depending of bacteria susceptibility.  Seek immediate medical attention if severe abdominal pain, vomiting, fever/chills, or worsening symptoms. F/U if symptomatic are not any better after 2-3 days of antibiotic treatment.    Urinary Tract Infection, Adult A urinary tract infection (UTI) is an infection of any part of the urinary tract, which includes the kidneys, ureters, bladder, and urethra. These organs make, store, and get rid of urine in the body. UTI can be a bladder infection (cystitis) or kidney infection (pyelonephritis). What are the causes? This infection may be caused by fungi, viruses, or bacteria. Bacteria are the most common cause of UTIs. This condition can also be caused by repeated incomplete emptying of the bladder during urination. What increases the risk? This condition is more likely to develop if:  You ignore your need to urinate or hold urine for long periods of time.  You do not empty your bladder completely during urination.  You wipe back to front after urinating or having a bowel movement, if you are female.  You are uncircumcised, if you are female.  You are constipated.  You have a urinary catheter that stays in place (indwelling).  You have a weak defense (immune) system.  You have a medical condition that affects your bowels, kidneys, or bladder.  You have  diabetes.  You take antibiotic medicines frequently or for long periods of time, and the antibiotics no longer work well against certain types of infections (antibiotic resistance).  You take medicines that irritate your urinary tract.  You are exposed to chemicals that irritate your urinary tract.  You are female.  What are the signs or symptoms? Symptoms of this condition include:  Fever.  Frequent urination or passing small amounts of urine frequently.  Needing to urinate urgently.  Pain or burning with urination.  Urine that smells bad or unusual.  Cloudy urine.  Pain in the lower abdomen or back.  Trouble urinating.  Blood in the urine.  Vomiting or being less hungry than normal.  Diarrhea or abdominal pain.  Vaginal discharge, if you are female.  How is this diagnosed? This condition is diagnosed with a medical history and physical exam. You will also need to provide a urine sample to test your urine. Other tests may be done, including:  Blood tests.  Sexually transmitted disease (STD) testing.  If you have had more than one UTI, a cystoscopy or imaging studies may be done to determine the cause of the infections. How is this treated? Treatment for this condition often includes a combination of two or more of the following:  Antibiotic medicine.  Other medicines to treat less common causes of UTI.  Over-the-counter medicines to treat pain.  Drinking enough water to stay hydrated.  Follow these instructions at home:  Take over-the-counter and prescription medicines only as told by your health care provider.  If you were prescribed an antibiotic, take it as told by your health care provider. Do not  stop taking the antibiotic even if you start to feel better.  Avoid alcohol, caffeine, tea, and carbonated beverages. They can irritate your bladder.  Drink enough fluid to keep your urine clear or pale yellow.  Keep all follow-up visits as told by your  health care provider. This is important.  Make sure to: ? Empty your bladder often and completely. Do not hold urine for long periods of time. ? Empty your bladder before and after sex. ? Wipe from front to back after a bowel movement if you are female. Use each tissue one time when you wipe. Contact a health care provider if:  You have back pain.  You have a fever.  You feel nauseous or vomit.  Your symptoms do not get better after 3 days.  Your symptoms go away and then return. Get help right away if:  You have severe back pain or lower abdominal pain.  You are vomiting and cannot keep down any medicines or water. This information is not intended to replace advice given to you by your health care provider. Make sure you discuss any questions you have with your health care provider. Document Released: 05/08/2005 Document Revised: 01/10/2016 Document Reviewed: 06/19/2015 Elsevier Interactive Patient Education  2017 [employer].

## 2017-05-14 ENCOUNTER — Encounter: Payer: Self-pay | Admitting: Family Medicine

## 2017-05-14 LAB — URINE CULTURE
MICRO NUMBER:: 81085695
SPECIMEN QUALITY:: ADEQUATE

## 2017-05-15 NOTE — Telephone Encounter (Signed)
Patient was seen at her PCP office on 05/12/2017 for evaluation. Encounter closed.

## 2017-05-22 DIAGNOSIS — M9902 Segmental and somatic dysfunction of thoracic region: Secondary | ICD-10-CM | POA: Diagnosis not present

## 2017-05-22 DIAGNOSIS — M9901 Segmental and somatic dysfunction of cervical region: Secondary | ICD-10-CM | POA: Diagnosis not present

## 2017-05-22 DIAGNOSIS — M9903 Segmental and somatic dysfunction of lumbar region: Secondary | ICD-10-CM | POA: Diagnosis not present

## 2017-06-02 DIAGNOSIS — M9902 Segmental and somatic dysfunction of thoracic region: Secondary | ICD-10-CM | POA: Diagnosis not present

## 2017-06-02 DIAGNOSIS — M9903 Segmental and somatic dysfunction of lumbar region: Secondary | ICD-10-CM | POA: Diagnosis not present

## 2017-06-02 DIAGNOSIS — M9901 Segmental and somatic dysfunction of cervical region: Secondary | ICD-10-CM | POA: Diagnosis not present

## 2017-07-17 ENCOUNTER — Encounter: Payer: Self-pay | Admitting: Certified Nurse Midwife

## 2017-07-17 ENCOUNTER — Other Ambulatory Visit: Payer: Self-pay

## 2017-07-17 ENCOUNTER — Ambulatory Visit: Payer: BLUE CROSS/BLUE SHIELD | Admitting: Certified Nurse Midwife

## 2017-07-17 VITALS — BP 98/62 | HR 64 | Resp 16 | Ht 60.75 in | Wt 159.0 lb

## 2017-07-17 DIAGNOSIS — Z789 Other specified health status: Secondary | ICD-10-CM

## 2017-07-17 DIAGNOSIS — Z113 Encounter for screening for infections with a predominantly sexual mode of transmission: Secondary | ICD-10-CM

## 2017-07-17 DIAGNOSIS — N898 Other specified noninflammatory disorders of vagina: Secondary | ICD-10-CM | POA: Diagnosis not present

## 2017-07-17 LAB — POCT URINE PREGNANCY: PREG TEST UR: NEGATIVE

## 2017-07-17 NOTE — Addendum Note (Signed)
Addended by: Eliezer BottomJOHNSON, Makayle Krahn J on: 07/17/2017 12:07 PM   Modules accepted: Orders

## 2017-07-17 NOTE — Progress Notes (Signed)
36 y.o. Single Caucasian female G0P0000 here with complaint of vaginal symptoms of odor and slight  burning, and change in discharge. Describes discharge as with odor," just not my normal". Also had condom to remain inside for a few minutes and removed. Requests STD screening and oral screening. Onset of symptoms 4-5 days ago. Denies new personal products or vaginal dryness. Took Plan B after sexual activity due to being Ovulation week. Had 4 days of light bleeding after use. Request UPT today also.No other health concerns today.  Urinary symptoms none . Contraception is condoms.  ROS Pertinent to above  O:Healthy female WDWN Affect: normal, orientation x 3  Exam:Skin: warm and dry Abdomen:soft,non tender  Inguinal Lymph nodes: no enlargement or tenderness Pelvic exam: External genital: normal female, no lesions or redness BUS: negative Vagina: copious odorous beige discharge noted.  Affirm taken Cervix: normal, non tender, no CMT Uterus: normal, non tender Adnexa:normal, non tender, no masses or fullness noted   A:Normal pelvic exam Contraception Condoms STD screening Vaginal odor   P:Discussed findings of Normal pelvic exam. and etiology. Discussed Aveeno or baking soda sitz bath for comfort and odor control. Will treat per results. Discussed condom use consistent for best protection. Questions addressed. Labs: oral,vaginal Gc/chlamydia, HIV,RPR, Hep C,vaginal screen, UPT    Rv prn

## 2017-07-18 LAB — HEPATITIS C ANTIBODY: Hep C Virus Ab: <0.1 s/co ratio (ref 0.0–0.9)

## 2017-07-18 LAB — HIV ANTIBODY (ROUTINE TESTING W REFLEX): HIV SCREEN 4TH GENERATION: NONREACTIVE

## 2017-07-18 LAB — RPR: RPR Ser Ql: NONREACTIVE

## 2017-07-18 LAB — GC/CHLAMYDIA PROBE AMP
Chlamydia trachomatis, NAA: NEGATIVE
NEISSERIA GONORRHOEAE BY PCR: NEGATIVE

## 2017-07-19 LAB — VAGINITIS/VAGINOSIS, DNA PROBE
CANDIDA SPECIES: NEGATIVE
GARDNERELLA VAGINALIS: POSITIVE — AB
Trichomonas vaginosis: NEGATIVE

## 2017-07-20 LAB — CT/GC NAA, PHARYNGEAL
C TRACH RRNA NPH QL PCR: NEGATIVE
N GONORRHOEA RRNA NPH QL PCR: NEGATIVE

## 2017-07-21 ENCOUNTER — Other Ambulatory Visit: Payer: Self-pay | Admitting: Certified Nurse Midwife

## 2017-07-21 DIAGNOSIS — B9689 Other specified bacterial agents as the cause of diseases classified elsewhere: Secondary | ICD-10-CM

## 2017-07-21 DIAGNOSIS — N760 Acute vaginitis: Principal | ICD-10-CM

## 2017-07-21 MED ORDER — METRONIDAZOLE 0.75 % VA GEL: VAGINAL | 0 refills | Status: DC

## 2017-08-14 ENCOUNTER — Telehealth: Payer: Self-pay | Admitting: Certified Nurse Midwife

## 2017-08-14 ENCOUNTER — Ambulatory Visit: Payer: BLUE CROSS/BLUE SHIELD | Admitting: Certified Nurse Midwife

## 2017-08-14 ENCOUNTER — Other Ambulatory Visit: Payer: Self-pay

## 2017-08-14 ENCOUNTER — Encounter: Payer: Self-pay | Admitting: Certified Nurse Midwife

## 2017-08-14 VITALS — BP 110/78 | HR 68 | Resp 16 | Ht 60.75 in | Wt 158.0 lb

## 2017-08-14 DIAGNOSIS — B009 Herpesviral infection, unspecified: Secondary | ICD-10-CM | POA: Diagnosis not present

## 2017-08-14 DIAGNOSIS — Z01419 Encounter for gynecological examination (general) (routine) without abnormal findings: Secondary | ICD-10-CM | POA: Diagnosis not present

## 2017-08-14 DIAGNOSIS — N898 Other specified noninflammatory disorders of vagina: Secondary | ICD-10-CM

## 2017-08-14 MED ORDER — VALACYCLOVIR HCL 1 G PO TABS: 1000.0000 mg | ORAL_TABLET | Freq: Two times a day (BID) | ORAL | 0 refills | Status: DC

## 2017-08-14 NOTE — Patient Instructions (Signed)

## 2017-08-14 NOTE — Progress Notes (Signed)
37 y.o. Single Caucasian female G0P0000 here due to noting bump in vaginal area on right. Onset in past 24 hours. Sensitive to touch, no itching but stinging sensation. No drainage or pustule. Noted white like blister. No new personal products. No sexual activity since last OV 07/17/17 screening and divorce from spouse. Known positive serology for HSV1,2, but no outbreaks. Periods have now been more normal now,  No issues. No new personal products or recent shaving. Some increase in vaginal discharge, but clear. Please check. No other health issues today.  ROS Pertinent to HPI  O:Healthy female WDWN Affect: normal, orientation x 3  Exam: Skin warm and dry Abdomen: Soft, non tender  Inguinal Lymph nodes:mild enlargement on right only no tenderness Pelvic exam: External genital: normal female with herpetic appearing blister on right vulva only, slightly red, tender. No other lesions noted bilaterally BUS: negative Vagina: clear mucous appearing non odorous  discharge noted. Ph:3.5   ,Wet prep taken, Affirm taken Cervix: normal, non tender, no CMT Uterus: normal, non tender Adnexa:normal, non tender, no masses or fullness noted   Wet Prep results:KOH,Saline negative for pathogens   A:Normal pelvic exam History of positive serology HSV 1,2  First outbreak suspect HSV 2, declined culture Vaginal wet prep negative   P:Discussed findings of herpes outbreak and etiology. Suspect HSV 2. Discussed Aveeno or baking soda sitz bath for comfort. Discussed transmission and suppression and treatment. Questions answered at length. Desires treatment. Rx Valtrex see order with instructions Discussed obtaining 1% hydrocortisone cream OTC to apply to blister bid which will help with tenderness and stinging. Discussed suppression dose daily vs treating on occurrence.  Given printed information on HSV. Will discuss at follow up.  Rv 10 days  15 minutes in addition to exam spent in discussion of HSV.

## 2017-08-14 NOTE — Telephone Encounter (Signed)
Patient is having a "flare up" and would like an appointment today with Robin Burke if possible.

## 2017-08-14 NOTE — Telephone Encounter (Signed)
Spoke with patient and complaining of first HSV outbreak on right vulva. Patient requesting appointment with Sara Chuebbie Leonard, CNM. Made appointment for today at 11:30am.

## 2017-08-22 ENCOUNTER — Telehealth: Payer: Self-pay | Admitting: Certified Nurse Midwife

## 2017-08-22 ENCOUNTER — Ambulatory Visit: Payer: BLUE CROSS/BLUE SHIELD | Admitting: Certified Nurse Midwife

## 2017-08-22 NOTE — Telephone Encounter (Signed)
Patient came in 15 minutes late to her appointment today for a 10 day recheck. She said she vomited earlier today and is not feeling well. Patient rescheduled to 08/26/17.

## 2017-08-26 ENCOUNTER — Ambulatory Visit: Payer: BLUE CROSS/BLUE SHIELD | Admitting: Certified Nurse Midwife

## 2017-08-26 ENCOUNTER — Encounter: Payer: Self-pay | Admitting: Certified Nurse Midwife

## 2017-08-26 ENCOUNTER — Other Ambulatory Visit: Payer: Self-pay

## 2017-08-26 VITALS — BP 110/64 | HR 60 | Resp 16 | Ht 60.75 in | Wt 161.0 lb

## 2017-08-26 DIAGNOSIS — B009 Herpesviral infection, unspecified: Secondary | ICD-10-CM | POA: Diagnosis not present

## 2017-08-26 DIAGNOSIS — R350 Frequency of micturition: Secondary | ICD-10-CM

## 2017-08-26 DIAGNOSIS — N898 Other specified noninflammatory disorders of vagina: Secondary | ICD-10-CM | POA: Diagnosis not present

## 2017-08-26 LAB — POCT URINALYSIS DIPSTICK
BILIRUBIN UA: NEGATIVE
GLUCOSE UA: NEGATIVE
KETONES UA: NEGATIVE
Leukocytes, UA: NEGATIVE
Nitrite, UA: NEGATIVE
PH UA: 5 (ref 5.0–8.0)
Protein, UA: NEGATIVE
RBC UA: NEGATIVE
UROBILINOGEN UA: NEGATIVE U/dL — AB

## 2017-08-26 MED ORDER — VALACYCLOVIR HCL 1 G PO TABS: 1000.0000 mg | ORAL_TABLET | Freq: Two times a day (BID) | ORAL | 0 refills | Status: DC

## 2017-08-26 NOTE — Patient Instructions (Signed)
Urinary Tract Infection, Adult A urinary tract infection (UTI) is an infection of any part of the urinary tract. The urinary tract includes the:  Kidneys.  Ureters.  Bladder.  Urethra.  These organs make, store, and get rid of pee (urine) in the body. Follow these instructions at home:  Take over-the-counter and prescription medicines only as told by your doctor.  If you were prescribed an antibiotic medicine, take it as told by your doctor. Do not stop taking the antibiotic even if you start to feel better.  Avoid the following drinks: ? Alcohol. ? Caffeine. ? Tea. ? Carbonated drinks.  Drink enough fluid to keep your pee clear or pale yellow.  Keep all follow-up visits as told by your doctor. This is important.  Make sure to: ? Empty your bladder often and completely. Do not to hold pee for long periods of time. ? Empty your bladder before and after sex. ? Wipe from front to back after a bowel movement if you are female. Use each tissue one time when you wipe. Contact a doctor if:  You have back pain.  You have a fever.  You feel sick to your stomach (nauseous).  You throw up (vomit).  Your symptoms do not get better after 3 days.  Your symptoms go away and then come back. Get help right away if:  You have very bad back pain.  You have very bad lower belly (abdominal) pain.  You are throwing up and cannot keep down any medicines or water. This information is not intended to replace advice given to you by your health care provider. Make sure you discuss any questions you have with your health care provider. Document Released: 01/15/2008 Document Revised: 01/04/2016 Document Reviewed: 06/19/2015 Elsevier Interactive Patient Education  2018 [employer]. Genital Herpes Genital herpes is a common sexually transmitted infection (STI) that is caused by a virus. The virus spreads from person to person through sexual contact. Infection can cause itching, blisters,  and sores around the genitals or rectum. Symptoms may last several days and then go away This is called an outbreak. However, the virus remains in your body, so you may have more outbreaks in the future. The time between outbreaks varies and can be months or years. Genital herpes affects men and women. It is particularly concerning for pregnant women because the virus can be passed to the baby during delivery and can cause serious problems. Genital herpes is also a concern for people who have a weak disease-fighting (immune) system. What are the causes? This condition is caused by the herpes simplex virus (HSV) type 1 or type 2. The virus may spread through:  Sexual contact with an infected person, including vaginal, anal, and oral sex.  Contact with fluid from a herpes sore.  The skin. This means that you can get herpes from an infected partner even if he or she does not have a visible sore or does not know that he or she is infected.  What increases the risk? You are more likely to develop this condition if:  You have sex with many partners.  You do not use latex condoms during sex.  What are the signs or symptoms? Most people do not have symptoms (asymptomatic) or have mild symptoms that may be mistaken for other skin problems. Symptoms may include:  Small red bumps near the genitals, rectum, or mouth. These bumps turn into blisters and then turn into sores.  Flu-like symptoms, including: ? Fever. ? Body aches. ?  Swollen lymph nodes. ? Headache.  Painful urination.  Pain and itching in the genital area or rectal area.  Vaginal discharge.  Tingling or shooting pain in the legs and buttocks.  Generally, symptoms are more severe and last longer during the first (primary) outbreak. Flu-like symptoms are also more common during the primary outbreak. How is this diagnosed? Genital herpes may be diagnosed based on:  A physical exam.  Your medical history.  Blood tests.  A  test of a fluid sample (culture) from an open sore.  How is this treated? There is no cure for this condition, but treatment with antiviral medicines that are taken by mouth (orally) can do the following:  Speed up healing and relieve symptoms.  Help to reduce the spread of the virus to sexual partners.  Limit the chance of future outbreaks, or make future outbreaks shorter.  Lessen symptoms of future outbreaks.  Your health care provider may also recommend pain relief medicines, such as aspirin or ibuprofen. Follow these instructions at home: Sexual activity  Do not have sexual contact during active outbreaks.  Practice safe sex. Latex condoms and female condoms may help prevent the spread of the herpes virus. General instructions  Keep the affected areas dry and clean.  Take over-the-counter and prescription medicines only as told by your health care provider.  Avoid rubbing or touching blisters and sores. If you do touch blisters or sores: ? Wash your hands thoroughly with soap and water. ? Do not touch your eyes afterward.  To help relieve pain or itching, you may take the following actions as directed by your health care provider: ? Apply a cold, wet cloth (cold compress) to affected areas 4-6 times a day. ? Apply a substance that protects your skin and reduces bleeding (astringent). ? Apply a gel that helps relieve pain around sores (lidocaine gel). ? Take a warm, shallow bath that cleans the genital area (sitz bath).  Keep all follow-up visits as told by your health care provider. This is important. How is this prevented?  Use condoms. Although anyone can get genital herpes during sexual contact, even with the use of a condom, a condom can provide some protection.  Avoid having multiple sexual partners.  Talk with your sexual partner about any symptoms either of you may have. Also, talk with your partner about any history of STIs.  Get tested for STIs before you  have sex. Ask your partner to do the same.  Do not have sexual contact if you have symptoms of genital herpes. Contact a health care provider if:  Your symptoms are not improving with medicine.  Your symptoms return.  You have new symptoms.  You have a fever.  You have abdominal pain.  You have redness, swelling, or pain in your eye.  You notice new sores on other parts of your body.  You are a woman and experience bleeding between menstrual periods.  You have had herpes and you become pregnant or plan to become pregnant. Summary  Genital herpes is a common sexually transmitted infection (STI) that is caused by the herpes simplex virus (HSV) type 1 or type 2.  These viruses are most often spread through sexual contact with an infected person.  You are more likely to develop this condition if you have sex with many partners or you have unprotected sex.  Most people do not have symptoms (asymptomatic) or have mild symptoms that may be mistaken for other skin problems. Symptoms occur as outbreaks that  may happen months or years apart.  There is no cure for this condition, but treatment with oral antiviral medicines can reduce symptoms, reduce the chance of spreading the virus to a partner, prevent future outbreaks, or shorten future outbreaks. This information is not intended to replace advice given to you by your health care provider. Make sure you discuss any questions you have with your health care provider. Document Released: 07/26/2000 Document Revised: 06/28/2016 Document Reviewed: 06/28/2016 Elsevier Interactive Patient Education  [employer]2018 Elsevier Inc[employer]

## 2017-08-26 NOTE — Progress Notes (Signed)
37 y.o. Single Caucasian female [id number] here for follow up of HSV 2  treated with Valtrex initiated on 08/14/17. Completed all medication as directed. Patient not experiencing pain at this time, but concerned that she has another outbreak, or has not cleared yet.Feels another bump in same area.  The original area opened with some pustular and clear fluid. Having burning with urination at end of stream only for the last 24  Hours, when touches skin.. Came in to have urine checked also.Denies fever,chills or back pain. Some increase in vaginal discharge with slight itching. No new personal products or STD concerns. "Just want to make sure no other issues". No other health complaints today.   O: Healthy WD,WN female Affect: normal orientation x 3 Skin: warm and dry CVAT bilateral negative Abdomen:soft, negative suprapubic pain Inguinal lymph nodes not enlarged or tender Pelvic exam:EXTERNAL GENITALIA: normal appearing vulva with new 2 new HSV blisters noted, tender to touch, previous area healing.. Also noted recent pubic hair shaving with some irritation note on skin, no rash, just increased pink appearance. BUS negative, Bladder non tender, urethral meatus non tender, no redness VAGINA: yellow slightly odorous discharge noted, affirm taken CERVIX: no lesions or cervical motion tenderness and normal appearance UTERUS: normal , non tender, no masses ADNEXA: no masses palpable and nontender RECTUM: no blisters noted in anal area  poct urine-neg  A: History of HSV 2 with recent outbreak with Valtrex use, patient completed medication today, new outbreak or continues with outbreak. R/O UTI urine negative and exam negative for UTI, feel related to urine touching blister R/O vaginal infection Normal pelvic exam    P: Discussed findings of normal pelvic exam and HSV 2 outbreak continues with new area or new outbreak. Recommend continue Valtrex twice daily for another 10 days and suppression dose there  after due to length of this occurrence. Patient agreeable. Rx valtrex see order with instructions No indication of UTI, increase water intake for less acidic urine. UTI warning signs given. Will treat if affirm indicates infection. Discussed epsom salt sitz bath for comfort and encouraged to use. Questions addressed at length. Labs: Affirm   RV prn

## 2017-08-27 ENCOUNTER — Ambulatory Visit: Payer: BLUE CROSS/BLUE SHIELD | Admitting: Certified Nurse Midwife

## 2017-08-27 LAB — VAGINITIS/VAGINOSIS, DNA PROBE
Candida Species: NEGATIVE
GARDNERELLA VAGINALIS: NEGATIVE
Trichomonas vaginosis: NEGATIVE

## 2017-08-29 LAB — URINE CULTURE: Organism ID, Bacteria: NO GROWTH

## 2017-09-18 ENCOUNTER — Ambulatory Visit: Payer: BLUE CROSS/BLUE SHIELD | Admitting: Certified Nurse Midwife

## 2017-09-22 ENCOUNTER — Telehealth: Payer: Self-pay | Admitting: Certified Nurse Midwife

## 2017-09-22 NOTE — Telephone Encounter (Signed)
Spoke with patient. Patient states she completed valacyclovir one week ago for hsv outbreak, calling with update and to start daily medication.   Denies any pain, patient unsure if outbreak has resolved. Patient previously scheduled for f/u on 2/12 with Leota Sauerseborah Leonard, CNM.   Advised patient to keep OV as scheduled with Leota Sauerseborah Leonard, CNM on 2/12 for recheck, will further discuss suppression therapy at that time. Advised will review with covering provider and return call with any additional recommendations. Patient is agreeable.   Routing to covering provider.Patient is agreeable to disposition. Will close encounter.  Cc: Leota Sauerseborah Leonard, CNM

## 2017-09-22 NOTE — Telephone Encounter (Signed)
Patient called with questions for the nurse about a medication she just finished.

## 2017-09-22 NOTE — Telephone Encounter (Signed)
Left message to call Robin Burke at [phone].  

## 2017-09-23 ENCOUNTER — Other Ambulatory Visit: Payer: Self-pay

## 2017-09-23 ENCOUNTER — Ambulatory Visit (INDEPENDENT_AMBULATORY_CARE_PROVIDER_SITE_OTHER): Payer: BLUE CROSS/BLUE SHIELD | Admitting: Certified Nurse Midwife

## 2017-09-23 ENCOUNTER — Encounter: Payer: Self-pay | Admitting: Certified Nurse Midwife

## 2017-09-23 VITALS — BP 110/70 | HR 70 | Resp 16 | Ht 60.75 in | Wt 158.0 lb

## 2017-09-23 DIAGNOSIS — R300 Dysuria: Secondary | ICD-10-CM | POA: Diagnosis not present

## 2017-09-23 DIAGNOSIS — Z113 Encounter for screening for infections with a predominantly sexual mode of transmission: Secondary | ICD-10-CM | POA: Diagnosis not present

## 2017-09-23 DIAGNOSIS — B009 Herpesviral infection, unspecified: Secondary | ICD-10-CM

## 2017-09-23 DIAGNOSIS — N3001 Acute cystitis with hematuria: Secondary | ICD-10-CM | POA: Diagnosis not present

## 2017-09-23 LAB — POCT URINALYSIS DIPSTICK
Bilirubin, UA: NEGATIVE
Glucose, UA: NEGATIVE
Ketones, UA: NEGATIVE
Nitrite, UA: POSITIVE
PH UA: 5 (ref 5.0–8.0)
Protein, UA: NEGATIVE
UROBILINOGEN UA: NEGATIVE U/dL — AB

## 2017-09-23 MED ORDER — NITROFURANTOIN MONOHYD MACRO 100 MG PO CAPS
ORAL_CAPSULE | ORAL | 0 refills | Status: DC
Start: 2017-09-23 — End: 2017-11-13

## 2017-09-23 MED ORDER — VALACYCLOVIR HCL 1 G PO TABS: ORAL_TABLET | ORAL | 12 refills | Status: DC

## 2017-09-23 MED ORDER — VALACYCLOVIR HCL 1 G PO TABS: 1000.0000 mg | ORAL_TABLET | Freq: Two times a day (BID) | ORAL | 0 refills | Status: DC

## 2017-09-23 NOTE — Progress Notes (Signed)
37 y.o. Single Caucasian female G0P0000 here for follow up of HSV 2 treated with Valtrex initiated on 08/26/17. Completed all medication as directed and has not started on suppression dose. Wanted to be rechecked before starting. Complaining of burning with urination after sexual activity in the last 24 hours. Patient also concerned about STD exposure and would like screening. Some increase in vaginal discharge, ? Odor. Has increased water intake, with some decrease in urinary symptoms. Will be leaving for [place] for a few weeks in 2 days. No other health issues today.  ROS: Pertinent to HPI  POCT Urine: WBC Trace, RBC Trace, Nitrites positive  O: Healthy WD,WN female Affect: normal Skin:warm and dry CVAT: bilateral negative Abdomen:soft, with positive suprapubic tenderness  Pelvic exam:EXTERNAL GENITALIA: normal appearing vulva with no masses, tenderness or lesions, small ingrown hair on left vulva noted, no HSV blisters noted Bladder: slight tender, Urethra, non tender, urethral meatus tender, slight increase in pink VAGINA: normal appearance, small amount of white slightly odorous  CERVIX: no lesions or cervical motion tenderness  UTERUS: normal, non tender, no masses ADNEXA: no masses palpable and nontender RECTUM: exam not indicated, normal appearance, no HSV blisters noted  A:Normal pelvic exam HSV 2 history, recent outbreak resolved, needs to start suppression now R/O STD's Post Coital UTI    P: Discussed findings of normal exam and no HSV blisters noted or new outbreak. Ingrown noted and instructions given to care for. Start Valtrex suppression dose now Rx Valtrex see order with instructions Lab: GC/Chlamydia, Affirm, STD panel, Hep C Discussed UTI related to sexual activity. Instructions given to empty bladder before and after sexual activity. Increase water intake. Questions addressed. Rx Macrobid see order with instructions Lab: Urine culture  RV prn, as above

## 2017-09-24 ENCOUNTER — Other Ambulatory Visit: Payer: Self-pay

## 2017-09-24 LAB — VAGINITIS/VAGINOSIS, DNA PROBE
Candida Species: NEGATIVE
Gardnerella vaginalis: POSITIVE — AB
Trichomonas vaginosis: NEGATIVE

## 2017-09-24 LAB — HEPATITIS C ANTIBODY

## 2017-09-24 LAB — HEP, RPR, HIV PANEL
HEP B S AG: NEGATIVE
HIV SCREEN 4TH GENERATION: NONREACTIVE
RPR Ser Ql: NONREACTIVE

## 2017-09-24 LAB — CHLAMYDIA/GC NAA, CONFIRMATION
Chlamydia trachomatis, NAA: NEGATIVE
NEISSERIA GONORRHOEAE, NAA: NEGATIVE

## 2017-09-24 MED ORDER — METRONIDAZOLE 0.75 % VA GEL: VAGINAL | 0 refills | Status: DC

## 2017-09-26 LAB — URINE CULTURE

## 2017-10-05 ENCOUNTER — Encounter: Payer: Self-pay | Admitting: Certified Nurse Midwife

## 2017-10-07 ENCOUNTER — Telehealth: Payer: Self-pay | Admitting: Certified Nurse Midwife

## 2017-10-07 NOTE — Telephone Encounter (Signed)
-----   Message from Verner Choleborah S Leonard, CNM sent at 09/26/2017  2:50 PM EST ----- Good Afternoon Robin Burke, Your urine culture showed Proteus Mirablis and is not as sensitive to the Macrobid prescription you are taking. If your symptoms are not resolved or resolving you need a change in medication to Ciprofloxacin 500 mg twice daily for 3 days. I realize you are out of the country, so that is why I included all the information regarding medication and the bacteria type, if you needed to seek treatment there. Have a good day. Debbi

## 2017-10-07 NOTE — Telephone Encounter (Signed)
lmtcb

## 2017-10-07 NOTE — Telephone Encounter (Signed)
Patient sent the following correspondence through MyChart. Routing to triage to assist patient with request.  ----- Message from Mychart, Generic sent at 10/05/2017 2:28 PM EST -----    I[place] back in the [place] so I have access to cipro if you prescribe and think I should take it.  ----- Message -----  From: Leota Sauerseborah Leonard, CNM  Sent: 10/05/2017 1:03 PM EST  To: Robin Burke  Subject: RE: Visit Follow-Up Question  Hi [name],  If you do not have access to the Cipro, I would advise completion of Macrobid. The BV could be causing some burning too. So this should resolve with completion of medication after 2-3 days. Valtrex is only one daily for suppression and increase to twice daily if outbreak only for 3 days.. Be sure you are drinking adequate water to flush bacteria out of bladder as we discussed.  Have a good day  Debbi    ----- Message -----   From: Robin ReiningZizzette Burke   Sent: 10/05/2017 10:07 AM EST    To: Leota Sauerseborah Leonard, CNM  Subject: Visit Follow-Up Question    Hi Dr. Eunice Blaseebbie,    Thank you for the update. I do still have a slight symptom of a UTI, not frequency of urination but just the burning feeling when I do urinate. That's all I can tell.    I've been taking all the medication you prescribed. I missed a couple doses and I still have a three dosages of the Nitrofurantoin left. Should I stop taking or finish? I understand you should always finish an antibiotic. I'm worried that I may be taking a lot of antibiotics- what are your thoughts?    Also, the valtrax Rx bottle says I should be taking it twice a day. Is that correct or just once a day for suppression as we discussed?    I also just finished the Metronidazole Vaginal Gel which I understand was for the BV?    Thanks for your help :-)    -[name]

## 2017-10-08 ENCOUNTER — Other Ambulatory Visit: Payer: Self-pay

## 2017-10-08 MED ORDER — CIPROFLOXACIN HCL 500 MG PO TABS: 500.0000 mg | ORAL_TABLET | Freq: Two times a day (BID) | ORAL | 0 refills | Status: DC

## 2017-10-20 ENCOUNTER — Telehealth: Payer: Self-pay | Admitting: Certified Nurse Midwife

## 2017-10-20 ENCOUNTER — Encounter: Payer: Self-pay | Admitting: Certified Nurse Midwife

## 2017-10-20 NOTE — Telephone Encounter (Signed)
-----   Message from Mychart, Generic sent at 10/20/2017 10:07 AM EDT -----    Hi Dr. Eunice Blaseebbie,    All good over here :-)    [facility]. Can I please get a refill to take as a suppressor?    Thank you :-)    [place]

## 2017-10-20 NOTE — Telephone Encounter (Signed)
Spoke with patient. Has completed Valtrex bid for 10 days, request suppression RX.   Advised RX for valacyclovir 1000 mg tab take one tab daily for suppression, increase to twice daily with outbreak, #45 with 12 RF sent to [facility] on 09/23/17. Patient will f/u with pharmacy for filling, is aware to return call with any additional questions.  Routing to provider for final review. Patient is agreeable to disposition. Will close encounter.

## 2017-10-20 NOTE — Telephone Encounter (Signed)
Routing to CMA for review. Okay to close encounter or is further follow up needed?

## 2017-10-21 NOTE — Telephone Encounter (Signed)
Patient was notified already of information through telephone call. Encounter closed.

## 2017-11-13 ENCOUNTER — Encounter: Payer: Self-pay | Admitting: Certified Nurse Midwife

## 2017-11-13 ENCOUNTER — Ambulatory Visit: Payer: BLUE CROSS/BLUE SHIELD | Admitting: Certified Nurse Midwife

## 2017-11-13 ENCOUNTER — Other Ambulatory Visit: Payer: Self-pay

## 2017-11-13 ENCOUNTER — Telehealth: Payer: Self-pay | Admitting: Certified Nurse Midwife

## 2017-11-13 VITALS — BP 102/60 | HR 68 | Resp 16 | Ht 60.75 in | Wt 159.0 lb

## 2017-11-13 DIAGNOSIS — N898 Other specified noninflammatory disorders of vagina: Secondary | ICD-10-CM

## 2017-11-13 DIAGNOSIS — Z113 Encounter for screening for infections with a predominantly sexual mode of transmission: Secondary | ICD-10-CM | POA: Diagnosis not present

## 2017-11-13 NOTE — Telephone Encounter (Signed)
Patient is having abnormal discharge and odor while on cycle.

## 2017-11-13 NOTE — Telephone Encounter (Signed)
Spoke with patient, reports thick, creamy, yellow vaginal d/c with "sour" odor, since 4/3. Denies any other GYN symptoms or pain. Has previous RX for Cipro, took one tablet on 4/3. No STD concerns. Tried baking soda baths after intercourse as previously instructed.  Recommended OV for further evaluation, OV scheduled for today at 12:45pm with Leota Sauerseborah Leonard, CNM.   Routing to provider for final review. Patient is agreeable to disposition. Will close encounter.

## 2017-11-13 NOTE — Progress Notes (Signed)
37 y.o. Single Hispanic female G0P0000 here with complaint of vaginal symptoms of  increase discharge with odor. Describes discharge as yellow creamy with odor.. Onset of symptoms 24 hours ago. Denies new personal products or partner change. Took one Cipro yesterday and feel symptoms improved since yesterday. Last sexual activity 5 days ago with scant blood with new lamb skin condom use and then used again with no issues with. Feels this is better. She is using baking soda sitz bath(1/4 cup) and feel she had used too much baking soda. No STD concerns, but would like Gc/Chlamydia screening only. Urinary symptoms none . Contraception is condoms, working well now. No other health issues today.  ROS Pertinent to above  O:Healthy female WDWN Affect: normal, orientation x 3  Exam:Skin warm and dry Abdomen:soft, non tender, no masses  inguinal Lymph nodes: no enlargement or tenderness Pelvic exam: External genital: normal female, no scaling or lesions BUS: negative Vagina: white non odorous discharge noted.   Affirm taken, GC/chlamydia screening obtained Cervix: normal, non tender, no CMT Uterus: normal, non tender, anteverted Adnexa:normal, non tender, no masses or fullness noted   A:Normal pelvic exam Suspected latex condom allergy, no issues with lamb skin condom now uses for contraception Vaginal odor per patient  R/O infection STD screening vaginal only  P:Discussed findings of normal pelvic exam and no odor noted at this time. Discussed would not recommend Cipro use for vaginal infection unless prescribed( used for other infection she had). Discussed Aveeno oatmeal or baking soda 1 tablespoon in sitz bath for comfort. Avoid moist clothes for extended period of time. If working out in gym clothes for long periods of time  [employer] use for skin protection prior to activity can be used to external skin for protection or lubrication. She has used this with good results. Questions  addressed. Lab: affirm, GC,chlamydia Patient will be called with results and treated if indicated.  Rv prn

## 2017-11-14 ENCOUNTER — Other Ambulatory Visit: Payer: Self-pay

## 2017-11-14 LAB — VAGINITIS/VAGINOSIS, DNA PROBE
Candida Species: NEGATIVE
GARDNERELLA VAGINALIS: POSITIVE — AB
TRICHOMONAS VAG: NEGATIVE

## 2017-11-14 MED ORDER — METRONIDAZOLE 500 MG PO TABS: 500.0000 mg | ORAL_TABLET | Freq: Two times a day (BID) | ORAL | 0 refills | Status: DC

## 2017-11-15 LAB — GC/CHLAMYDIA PROBE AMP
CHLAMYDIA, DNA PROBE: NEGATIVE
Neisseria gonorrhoeae by PCR: NEGATIVE

## 2017-12-08 ENCOUNTER — Encounter: Payer: Self-pay | Admitting: Certified Nurse Midwife

## 2017-12-08 ENCOUNTER — Telehealth: Payer: Self-pay | Admitting: Certified Nurse Midwife

## 2017-12-08 NOTE — Telephone Encounter (Signed)
Patient called requesting to speak with the nurse about a "vaginal lesion." She'd like to discuss her symptoms before possibly scheduling an appointment, if needed.

## 2017-12-08 NOTE — Telephone Encounter (Signed)
Spoke with patient. Reports small, closed,  "pimple" on left outer labia, appeared 4/28. Has a "white head", no redness, no pain, no drainage. Patient has Hx of HSV 2, states this does not look like previous HSV outbreaks, asking if she should proceed with wax appointment scheduled for today? And if she should increase valtrex?   Currently take Valtrex 1000 mg daily. Denies any other gyn symptoms.  Recommended not proceeding with waxing if any concerns. OV offered, patient declined. Patient states she will continue to monitor, will call for OV if new symptoms develop, symptoms worsen or do not resolve. Valtrex recommended as prescribed for HSV outbreak.  Will review with covering provider and return call with any additional recommendations.   Routing to provider for final review. Patient is agreeable to disposition. Will close encounter.  Cc: Leota Sauers, CNM

## 2017-12-08 NOTE — Telephone Encounter (Signed)
See telephone encounter dated 12/08/17.  Routing to covering provider for final review, will close encounter.  Routing to provider for final review. Patient is agreeable to disposition. Will close encounter.  Cc: Leota Sauers, CNM

## 2017-12-08 NOTE — Telephone Encounter (Signed)
-----   Message from Mychart, Generic sent at 12/08/2017 9:06 AM EDT -----    Hi Dr. Eunice Blase,    I have a small lesion on my outer labia. I'm not sure if it's just a pimple or an HSV outbreak. Is it necessary to come in and check? Or should I just double dose on my valtrax until it goes away?

## 2017-12-09 NOTE — Telephone Encounter (Signed)
Agreed with plan

## 2018-01-15 ENCOUNTER — Telehealth: Payer: Self-pay | Admitting: Certified Nurse Midwife

## 2018-01-15 NOTE — Telephone Encounter (Signed)
Patient reports sensitive area on left inner labia that she is unsure is a HSV lesion or pimple. Area was sensitive on 6/4, doubled valtrex x 3 days. Epsom salt soak last night, area now has a white head. Reports an abnormal yellow/tan, creamy vaginal d/c started on 6/4. Patient is concerned and unsure if the symptoms are related.   Denies odor, bleeding, pelvic pain, fever/chills.   Recommended OV for further evaluation. Advised patient Leota SauersDeborah Leonard, CNM is out of the office 6/7 -6/10, patient agreeable to scheduling with covering provider. OV scheduled for 6/7 at 3pm with Dr. Hyacinth MeekerMiller.   Routing to provider for final review. Patient is agreeable to disposition. Will close encounter.  Cc: Dr. Hyacinth MeekerMiller

## 2018-01-15 NOTE — Telephone Encounter (Signed)
Patient called and requested a call back from the nurse. She said is has a "leison that may be from HSV" she has some questions about.

## 2018-01-16 ENCOUNTER — Ambulatory Visit: Payer: BLUE CROSS/BLUE SHIELD | Admitting: Obstetrics & Gynecology

## 2018-01-16 ENCOUNTER — Other Ambulatory Visit: Payer: Self-pay

## 2018-01-16 ENCOUNTER — Encounter: Payer: Self-pay | Admitting: Obstetrics & Gynecology

## 2018-01-16 VITALS — BP 136/84 | HR 92 | Resp 16 | Ht 60.75 in | Wt 150.6 lb

## 2018-01-16 DIAGNOSIS — N898 Other specified noninflammatory disorders of vagina: Secondary | ICD-10-CM | POA: Diagnosis not present

## 2018-01-16 DIAGNOSIS — N9089 Other specified noninflammatory disorders of vulva and perineum: Secondary | ICD-10-CM

## 2018-01-16 DIAGNOSIS — N760 Acute vaginitis: Secondary | ICD-10-CM | POA: Diagnosis not present

## 2018-01-16 NOTE — Progress Notes (Signed)
GYNECOLOGY  VISIT  CC:   Vaginal discharge  HPI: 37 y.o. G0P0000 Single Hispanic female here for complaint of vaginal discharge, desirous of STD testing.  As well, she wants me to look at a place on her labia that is mildly tender and a little enlarged/swollen.  Has had something similar in the past and saw Debbie letter and at that time.  Review of the medical record shows that on August 14, 2017 she presented with a lesion on the labia that visually looks like HSV-2.  She previously has had serology showing positive antibodies to HSV 1 and 2.  This was thought to be a primary herpetic outbreak.  Today, the patient states that her symptoms are exactly the same as they were on January 3.  She wanted another opinion about what this actually was.  She is very tearful today.  Reports her relationship with her long-term boyfriend is not good.  Since discussing possible HSV exposure with him, she is learned that he has been unfaithful.  She still in the relationship but feels she needs to end it.  She is very sad about this.  She is also struggling with the possibility of having HSV.  She is taking Valtrex right now.  She has many questions.  GYNECOLOGIC HISTORY: Patient's last menstrual period was 01/06/2018 (exact date). Contraception: condoms  Menopausal hormone therapy: none  Patient Active Problem List   Diagnosis Date Noted  . Vitamin D deficiency 10/28/2016  . Chronic fatigue 12/31/2013  . Thyroid nodule 12/31/2013    Past Medical History:  Diagnosis Date  . 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 STD (female) 2010   HSV I & II  by serology  . Thyroid nodule    "all of my tests have come back normal"     Past Surgical History:  Procedure Laterality Date  . CHOLECYSTECTOMY N/A 01/22/2017   Procedure: LAPAROSCOPIC CHOLECYSTECTOMY;  Surgeon: Axel Filleramirez, Armando, MD;  [facility];  Service: General;  Laterality: N/A;  . WISDOM TOOTH EXTRACTION  2007    MEDS:   Current  Outpatient Medications on File Prior to Visit  Medication Sig Dispense Refill  . valACYclovir (VALTREX) 1000 MG tablet Take one tablet daily for suppression and increase to one twice daily if outbreak 45 tablet 12   No current facility-administered medications on file prior to visit.     ALLERGIES: No known allergies  Family History  Problem Relation Age of Onset  . Diabetes Mother   . Hyperlipidemia Mother   . Coronary artery disease Father        Had 1 stent placed  . Thyroid cancer Maternal Grandmother   . Osteoarthritis Maternal Grandmother   . Diabetes Brother     SH: Single, non-smoker  Review of Systems  Genitourinary:       Abnormal discharge   All other systems reviewed and are negative.   PHYSICAL EXAMINATION:    BP 136/84 (BP Location: Right Arm, Patient Position: Sitting, Cuff Size: Large)   Pulse 92   Resp 16   Ht 5' 0.75" (1.543 m)   Wt 150 lb 9.6 oz (68.3 kg)   LMP 01/06/2018 (Exact Date)   BMI 28.69 kg/m     General appearance: alert, cooperative and appears stated age Abdomen: soft, non-tender; bowel sounds normal; no masses,  no organomegaly  Pelvic: External genitalia: Enlargement of the inner labia minora on the left.  There is no vesicular appearing lesions.  This appears  like a possible early labia minora abscess or an area of irritation but does not have a classic appearance for HSV              Urethra:  normal appearing urethra with no masses, tenderness or lesions              Bartholins and Skenes: normal                 Vagina: normal appearing vagina with normal color, scant discharge was noted no lesions were present              Cervix: no lesions              Bimanual Exam:  Uterus:  normal size, contour, position, consistency, mobility, non-tender              Adnexa: no mass, fullness, tenderness              Anus:   no lesions  Chaperone was present for exam.  Assessment: Mild labia minora enlargement on the left--irritation?  But not classic appearance of HSV Vaginal discharge, desires STD testing  Plan: Nuswab plus obtained today HSV testing of lesion obtained today.  This does not appear to be a classic HSV lesion.  Pt states this is exactly what prior lesion looked like.  If testing is positive, then has atypical appearing lesions.  Would also recommend increasing valtrex to 1000mg  if positive.  If negative, will likely need another OV for additional questions.   ~25 minutes spent with patient >50% of time was in face to face discussion of above.

## 2018-01-19 LAB — NUSWAB VAGINITIS PLUS (VG+)
CANDIDA ALBICANS, NAA: NEGATIVE
CHLAMYDIA TRACHOMATIS, NAA: NEGATIVE
Candida glabrata, NAA: NEGATIVE
Neisseria gonorrhoeae, NAA: NEGATIVE
Trich vag by NAA: NEGATIVE

## 2018-01-21 LAB — HSV NAA
HSV 1 NAA: NEGATIVE
HSV 2 NAA: NEGATIVE

## 2018-03-18 ENCOUNTER — Ambulatory Visit: Payer: BLUE CROSS/BLUE SHIELD | Admitting: Certified Nurse Midwife

## 2018-04-01 ENCOUNTER — Ambulatory Visit: Payer: BLUE CROSS/BLUE SHIELD | Admitting: Certified Nurse Midwife

## 2018-05-08 ENCOUNTER — Other Ambulatory Visit: Payer: Self-pay

## 2018-05-08 ENCOUNTER — Encounter: Payer: Self-pay | Admitting: Certified Nurse Midwife

## 2018-05-08 ENCOUNTER — Ambulatory Visit: Payer: BLUE CROSS/BLUE SHIELD | Admitting: Certified Nurse Midwife

## 2018-05-08 VITALS — BP 112/70 | HR 70 | Resp 16 | Ht 60.75 in | Wt 159.0 lb

## 2018-05-08 DIAGNOSIS — R5383 Other fatigue: Secondary | ICD-10-CM

## 2018-05-08 DIAGNOSIS — E041 Nontoxic single thyroid nodule: Secondary | ICD-10-CM

## 2018-05-08 DIAGNOSIS — R635 Abnormal weight gain: Secondary | ICD-10-CM

## 2018-05-08 DIAGNOSIS — E559 Vitamin D deficiency, unspecified: Secondary | ICD-10-CM | POA: Diagnosis not present

## 2018-05-08 DIAGNOSIS — Z23 Encounter for immunization: Secondary | ICD-10-CM | POA: Diagnosis not present

## 2018-05-08 DIAGNOSIS — Z01419 Encounter for gynecological examination (general) (routine) without abnormal findings: Secondary | ICD-10-CM

## 2018-05-08 DIAGNOSIS — R6889 Other general symptoms and signs: Secondary | ICD-10-CM | POA: Diagnosis not present

## 2018-05-08 DIAGNOSIS — E789 Disorder of lipoprotein metabolism, unspecified: Secondary | ICD-10-CM | POA: Diagnosis not present

## 2018-05-08 NOTE — Patient Instructions (Signed)

## 2018-05-08 NOTE — Progress Notes (Signed)
37 y.o. G0P0000 Single  Hispanic Fe here for annual exam.Periods normal, no issues,other than slightly heavier.. Contraception condoms consistent. No partner change no STD concerns or testing. Sees Urgent care if needed. Complaining of fatigue, that she feels may be thyroid related, but wants to see another endocrinologist other than Dr. Wyonia Hough. Would like referral to have nodule and thyroid labs done as needed. Eating well and some weight gain, but working on. No other health issues today.  Patient's last menstrual period was 04/25/2018 (exact date).          Sexually active: Yes.    The current method of family planning is condoms sometimes.    Exercising: Yes.    hot yoga Smoker:  no  Review of Systems  Constitutional: Positive for malaise/fatigue.  HENT: Negative.   Eyes: Negative.   Respiratory: Negative.   Cardiovascular: Negative.   Gastrointestinal: Negative.   Genitourinary: Negative.   Musculoskeletal: Negative.   Skin: Negative.   Neurological: Negative.   Endo/Heme/Allergies: Negative.   Psychiatric/Behavioral: Negative.     Health Maintenance: Pap:  06-13-15 neg HPV HR neg, 03-13-17 neg History of Abnormal Pap: yes MMG:  none Self Breast exams: occ Colonoscopy:  none BMD:   none TDaP:  2009 Shingles: no Pneumonia: no Hep C and HIV: both neg 2019 Labs: yes   reports that she quit smoking about 6 years ago. Her smoking use included cigarettes. She has a 2.50 pack-year smoking history. She has never used smokeless tobacco. She reports that she drinks about 1.0 standard drinks of alcohol per week. She reports that she does not use drugs.  Past Medical History:  Diagnosis Date  . 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 STD (female) 2010   HSV I & II  by serology  . Thyroid nodule    "all of my tests have come back normal"     Past Surgical History:  Procedure Laterality Date  . CHOLECYSTECTOMY N/A 01/22/2017   Procedure: LAPAROSCOPIC CHOLECYSTECTOMY;   Surgeon: Axel Filler, MD;  Location: [facility] OR;  Service: General;  Laterality: N/A;  . WISDOM TOOTH EXTRACTION  2007    No current outpatient medications on file.   No current facility-administered medications for this visit.     Family History  Problem Relation Age of Onset  . Diabetes Mother   . Hyperlipidemia Mother   . Coronary artery disease Father        Had 1 stent placed  . Thyroid cancer Maternal Grandmother   . Osteoarthritis Maternal Grandmother   . Diabetes Brother     ROS:  Pertinent items are noted in HPI.  Otherwise, a comprehensive ROS was negative.  Exam:   BP 112/70   Pulse 70   Resp 16   Ht 5' 0.75" (1.543 m)   Wt 159 lb (72.1 kg)   LMP 04/25/2018 (Exact Date)   BMI 30.29 kg/m  Height: 5' 0.75" (154.3 cm) Ht Readings from Last 3 Encounters:  05/08/18 5' 0.75" (1.543 m)  01/16/18 5' 0.75" (1.543 m)  11/13/17 5' 0.75" (1.543 m)    General appearance: alert, cooperative and appears stated age Head: Normocephalic, without obvious abnormality, atraumatic Neck: no adenopathy, supple, symmetrical, trachea midline and thyroid enlarged and known nodule on right Lungs: clear to auscultation bilaterally Breasts: normal appearance, no masses or tenderness, No nipple retraction or dimpling, No nipple discharge or bleeding, No axillary or supraclavicular adenopathy Heart: regular rate and rhythm Abdomen: soft, non-tender; no masses,  no organomegaly Extremities: extremities normal, atraumatic, no cyanosis or edema Skin: Skin color, texture, turgor normal. No rashes or lesions Lymph nodes: Cervical, supraclavicular, and axillary nodes normal. No abnormal inguinal nodes palpated Neurologic: Grossly normal   Pelvic: External genitalia:  no lesions              Urethra:  normal appearing urethra with no masses, tenderness or lesions              Bartholin's and Skene's: normal                 Vagina: normal appearing vagina with normal color and discharge, no  lesions              Cervix: no cervical motion tenderness, no lesions and nulliparous appearance              Pap taken: No. Bimanual Exam:  Uterus:  normal size, contour, position, consistency, mobility, non-tender and anteverted              Adnexa: normal adnexa and no mass, fullness, tenderness               Rectovaginal: Confirms               Anus:  normal sphincter tone, no lesions  Chaperone present: yes  A:  Well Woman with normal exam  Contraception condoms consistent  Known thyroid nodule, no follow in the past year  Fatigue  Screening lab  Immunization update  P:   Reviewed health and wellness pertinent to exam  Discussed due to family history thyroid cancer,needs to continue follow up . Patient does not want to go back to previous practice and would like referral to another practice.  Discussed eating good protein and adequate sleep to help with fatigue. Will check CBC due to heavy period at times which can contribute.  Lab: CBC   Lipid panel  Requests TDAP  Pap smear: no   counseled on breast self exam, STD prevention, HIV risk factors and prevention, adequate intake of calcium and vitamin D, diet and exercise  return annually or prn  An After Visit Summary was printed and given to the patient.

## 2018-05-09 LAB — CBC
HEMATOCRIT: 43.2 % (ref 34.0–46.6)
HEMOGLOBIN: 13.4 g/dL (ref 11.1–15.9)
MCH: 25.5 pg — AB (ref 26.6–33.0)
MCHC: 31 g/dL — AB (ref 31.5–35.7)
MCV: 82 fL (ref 79–97)
Platelets: 225 10*3/uL (ref 150–450)
RBC: 5.26 x10E6/uL (ref 3.77–5.28)
RDW: 13.2 % (ref 12.3–15.4)
WBC: 6.5 10*3/uL (ref 3.4–10.8)

## 2018-05-09 LAB — LIPID PANEL
CHOL/HDL RATIO: 2.9 ratio (ref 0.0–4.4)
Cholesterol, Total: 185 mg/dL (ref 100–199)
HDL: 63 mg/dL (ref >39)
LDL CALC: 109 mg/dL — AB (ref 0–99)
Triglycerides: 65 mg/dL (ref 0–149)
VLDL Cholesterol Cal: 13 mg/dL (ref 5–40)

## 2018-05-09 LAB — VITAMIN D 25 HYDROXY (VIT D DEFICIENCY, FRACTURES): Vit D, 25-Hydroxy: 24.3 ng/mL — ABNORMAL LOW (ref 30.0–100.0)

## 2018-05-10 ENCOUNTER — Other Ambulatory Visit: Payer: Self-pay | Admitting: Certified Nurse Midwife

## 2018-05-10 DIAGNOSIS — E559 Vitamin D deficiency, unspecified: Secondary | ICD-10-CM

## 2018-05-13 ENCOUNTER — Encounter

## 2018-05-13 ENCOUNTER — Ambulatory Visit: Payer: BLUE CROSS/BLUE SHIELD | Admitting: Certified Nurse Midwife

## 2018-07-20 ENCOUNTER — Telehealth: Payer: Self-pay | Admitting: Certified Nurse Midwife

## 2018-07-20 DIAGNOSIS — E041 Nontoxic single thyroid nodule: Secondary | ICD-10-CM

## 2018-07-20 NOTE — Telephone Encounter (Signed)
Patient can be scheduled with Dr. Hyacinth MeekerMiller to be assessed.

## 2018-07-20 NOTE — Telephone Encounter (Signed)
Call to patient. Patient asking about a referral to a fertility doctor. Patient states that she is "not fully trying for pregnancy." Is going through a divorce, but is now with someone she would like to think about having kids with. Patient states she was married for 13 years and did not use contraception and never got pregnant. Concerned about fertility status due to years of trying and being 37 years old. OV offered to patient to discuss concerns with Leota Sauerseborah Leonard, CNM. Patient declines stating she discussed this with Debbi at her [facility] in September. Patient states she thought Debbi was going to do a referral then. RN advised would send message to Debbi and return call with additional recommendations. Patient agreeable. Patient aware Debbi is out of the office today.  Routing to provider for review.

## 2018-07-20 NOTE — Telephone Encounter (Signed)
Patient would like to discuss possibly going to a fertility doctor.

## 2018-07-21 NOTE — Telephone Encounter (Signed)
Left message to call Averie Meiner, RN at [facility] [phone].   

## 2018-07-23 NOTE — Telephone Encounter (Signed)
Spoke with patient.  1. Advised as seen below per Robin Burke, CNM. OV scheduled for fertility consult with Dr. Hyacinth Burke on 08/28/18 at 3:30pm.   2. Patient requesting referral to endocrinology. Patient states she discussed this at [facility] 05/08/18.  Does not want to return to previous provider. Hx of thyroid nodule and family Hx of thyroid cancer. Advised I will review with Robin Burke, CNM and return call.    Robin Burke, CNM -please advise on endocrinology referral.   Cc: Dr. Hyacinth Burke

## 2018-07-24 NOTE — Telephone Encounter (Signed)
She should try to see one of the other providers in that practice and will not need referral I would think

## 2018-07-27 NOTE — Telephone Encounter (Signed)
I'm unsure who this pt is seeing for thyroid issues.  I reviewed notes and cannot tell.  I would just refer her to another practice.  Please let me know who she is seeing and then we can make recommendation for another provider and place referral.  Thanks.

## 2018-07-28 NOTE — Telephone Encounter (Signed)
Yes, that is fine.  Thank you.

## 2018-07-28 NOTE — Telephone Encounter (Signed)
Spoke with patient, request to proceed with referral to Dr. Talmage NapBalan. Order placed. Patient aware she will be called with appt details once scheduled.   Routing to [employer].   Encounter closed.

## 2018-07-28 NOTE — Telephone Encounter (Signed)
Patient seen by Dr. Elvera LennoxGherghe in 05/2016.  Ok to place referral to Dr. Talmage NapBalan?

## 2018-08-18 ENCOUNTER — Other Ambulatory Visit (INDEPENDENT_AMBULATORY_CARE_PROVIDER_SITE_OTHER): Payer: BLUE CROSS/BLUE SHIELD

## 2018-08-18 DIAGNOSIS — E559 Vitamin D deficiency, unspecified: Secondary | ICD-10-CM

## 2018-08-19 ENCOUNTER — Other Ambulatory Visit: Payer: Self-pay | Admitting: Certified Nurse Midwife

## 2018-08-19 ENCOUNTER — Other Ambulatory Visit: Payer: Self-pay

## 2018-08-19 DIAGNOSIS — E559 Vitamin D deficiency, unspecified: Secondary | ICD-10-CM

## 2018-08-19 LAB — VITAMIN D 25 HYDROXY (VIT D DEFICIENCY, FRACTURES): Vit D, 25-Hydroxy: 16.7 ng/mL — ABNORMAL LOW (ref 30.0–100.0)

## 2018-08-19 NOTE — Telephone Encounter (Signed)
Left message for call back.

## 2018-08-20 DIAGNOSIS — M9901 Segmental and somatic dysfunction of cervical region: Secondary | ICD-10-CM | POA: Diagnosis not present

## 2018-08-20 DIAGNOSIS — M9902 Segmental and somatic dysfunction of thoracic region: Secondary | ICD-10-CM | POA: Diagnosis not present

## 2018-08-21 NOTE — Telephone Encounter (Signed)
Left message for call back.

## 2018-08-25 NOTE — Telephone Encounter (Signed)
Left message for call back.

## 2018-08-28 ENCOUNTER — Encounter: Payer: Self-pay | Admitting: Obstetrics & Gynecology

## 2018-08-28 ENCOUNTER — Ambulatory Visit: Payer: BLUE CROSS/BLUE SHIELD | Admitting: Obstetrics & Gynecology

## 2018-08-28 ENCOUNTER — Other Ambulatory Visit: Payer: Self-pay

## 2018-08-28 VITALS — BP 110/70 | HR 72 | Resp 16 | Ht 60.75 in | Wt 166.0 lb

## 2018-08-28 DIAGNOSIS — Z3141 Encounter for fertility testing: Secondary | ICD-10-CM

## 2018-08-28 MED ORDER — VITAMIN D (ERGOCALCIFEROL) 1.25 MG (50000 UNIT) PO CAPS: 50000.0000 [IU] | ORAL_CAPSULE | ORAL | 0 refills | Status: DC

## 2018-08-28 NOTE — Telephone Encounter (Signed)
Patient notified of results. See lab. rx sent to pharmacy.

## 2018-08-28 NOTE — Progress Notes (Signed)
GYNECOLOGY  VISIT  CC:   Questions regarding fertility  HPI: 38 y.o. G0P0000 Single White or Caucasian female here for discussion of fertility.  She is dating a 37 yo man and the relaetionship is great but he is not ready for a family.  She could see herself with him but knows if they wait several years, she may have trouble with pregnancy at that times.  Would like to know what her current fertility status is and what are her options for conception.  AMH vs FSH discussed.  As well, additional parts of fertility evaluation all reviewed.  Egg retrieval and freezing as well as egg retrieval with inseminations and then embryo freezing also an option.  She states he would like not interested in giving sperm at this time for an embryo.    To the best of my ability, discussed ovarian stimulation, egg retrieval, egg freezing and use later with success rates.  Procedures, risks and options reviewed.  Cost also reviewed.  Pt had many appropriate questions.  Parents are willing to help with cost.  She's not checked any insurance benefit to date but will do this today.  GYNECOLOGIC HISTORY: Patient's last menstrual period was 08/16/2018 (exact date). Contraception: condoms, sometimes Menopausal hormone therapy: n/a  Patient Active Problem List   Diagnosis Date Noted  . Vitamin D deficiency 10/28/2016  . Chronic fatigue 12/31/2013  . Thyroid nodule 12/31/2013    Past Medical History:  Diagnosis Date  . 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 STD (female) 2010   HSV I & II  by serology  . Thyroid nodule     Past Surgical History:  Procedure Laterality Date  . CHOLECYSTECTOMY N/A 01/22/2017   Procedure: LAPAROSCOPIC CHOLECYSTECTOMY;  Surgeon: Axel Filleramirez, Armando, MD;  [facility];  Service: General;  Laterality: N/A;  . WISDOM TOOTH EXTRACTION  2007    MEDS:   Current Outpatient Medications on File Prior to Visit  Medication Sig Dispense Refill  . Vitamin D, Ergocalciferol, (DRISDOL)  1.25 MG (50000 UT) CAPS capsule Take 1 capsule (50,000 Units total) by mouth every 7 (seven) days. 12 capsule 0   No current facility-administered medications on file prior to visit.     ALLERGIES: No known allergies  Family History  Problem Relation Age of Onset  . Diabetes Mother   . Hyperlipidemia Mother   . Coronary artery disease Father        Had 1 stent placed  . Thyroid cancer Maternal Grandmother   . Osteoarthritis Maternal Grandmother   . Diabetes Brother     SH:  Single, non smoker  Review of Systems  All other systems reviewed and are negative.   PHYSICAL EXAMINATION:    BP 110/70 (BP Location: Right Arm, Patient Position: Sitting, Cuff Size: Normal)   Pulse 72   Resp 16   Ht 5' 0.75" (1.543 m)   Wt 166 lb (75.3 kg)   LMP 08/16/2018 (Exact Date)   BMI 31.62 kg/m     General appearance: alert, cooperative and appears stated age No other exam was performed today  Assessment: Desires to understand fertility testing  Plan: AMH obtained today.  As long as this is not low, feel she will benefit from referral to [facility].  Referral to Dr. April MansonYalcinkaya will be placed.  She is going to check her own insurance.  Pt had many questions today which were all answered.   ~30 minutes spent with patient >50% of time was in  face to face discussion of above.

## 2018-09-02 LAB — ANTI MULLERIAN HORMONE: ANTI-MULLERIAN HORMONE (AMH): 1.27 ng/mL

## 2018-09-03 DIAGNOSIS — R635 Abnormal weight gain: Secondary | ICD-10-CM | POA: Diagnosis not present

## 2018-09-03 DIAGNOSIS — Z808 Family history of malignant neoplasm of other organs or systems: Secondary | ICD-10-CM | POA: Diagnosis not present

## 2018-09-03 DIAGNOSIS — E063 Autoimmune thyroiditis: Secondary | ICD-10-CM | POA: Diagnosis not present

## 2018-09-03 DIAGNOSIS — E041 Nontoxic single thyroid nodule: Secondary | ICD-10-CM | POA: Diagnosis not present

## 2018-09-04 ENCOUNTER — Other Ambulatory Visit: Payer: Self-pay | Admitting: Internal Medicine

## 2018-09-04 DIAGNOSIS — E041 Nontoxic single thyroid nodule: Secondary | ICD-10-CM

## 2018-09-04 DIAGNOSIS — Z808 Family history of malignant neoplasm of other organs or systems: Secondary | ICD-10-CM

## 2018-09-08 ENCOUNTER — Other Ambulatory Visit: Payer: BLUE CROSS/BLUE SHIELD

## 2018-09-16 DIAGNOSIS — R635 Abnormal weight gain: Secondary | ICD-10-CM | POA: Diagnosis not present

## 2018-09-16 DIAGNOSIS — E063 Autoimmune thyroiditis: Secondary | ICD-10-CM | POA: Diagnosis not present

## 2018-09-22 DIAGNOSIS — Z3169 Encounter for other general counseling and advice on procreation: Secondary | ICD-10-CM | POA: Diagnosis not present

## 2018-11-13 ENCOUNTER — Other Ambulatory Visit: Payer: Self-pay | Admitting: Certified Nurse Midwife

## 2018-11-13 NOTE — Telephone Encounter (Signed)
4 tablets sent. She needs her f/u vit d level in order to make further plans.

## 2018-11-13 NOTE — Telephone Encounter (Signed)
Medication refill request: vitamin d 50,000iu Last AEX:  05-08-18 Next AEX: 05-14-2019 Last MMG (if hormonal medication request): none Refill authorized:Recheck lab is 12-01-2018. Please approve of deny as appropriate.

## 2018-12-01 ENCOUNTER — Other Ambulatory Visit: Payer: BLUE CROSS/BLUE SHIELD

## 2018-12-07 ENCOUNTER — Other Ambulatory Visit: Payer: Self-pay | Admitting: Obstetrics and Gynecology

## 2018-12-07 NOTE — Telephone Encounter (Signed)
Medication refill request: Vit D  Last AEX:  05/08/18 DL Next AEX: 45/6/25  Last MMG (if hormonal medication request): none Refill authorized: 11/13/18 #4caps/0R. Today please advise.

## 2018-12-20 ENCOUNTER — Other Ambulatory Visit: Payer: Self-pay | Admitting: Obstetrics and Gynecology

## 2018-12-22 ENCOUNTER — Other Ambulatory Visit: Payer: Self-pay | Admitting: Obstetrics and Gynecology

## 2018-12-22 NOTE — Telephone Encounter (Signed)
Please have patient schedule her vit D recheck for one month.  I will refill her prescription vit D for one month.

## 2018-12-22 NOTE — Telephone Encounter (Signed)
Medication refill request: Vitamin D 50,000 IU weekly Last AEX:  05/08/2018 Next AEX: 05/14/2019 Last MMG (if hormonal medication request): NA Refill authorized: Due to COVID 19 all vitamin D rechecks have been required to be placed on hold. Please advise on refill or if the patient needs to take OTC Vitamin D until her recheck.

## 2018-12-23 NOTE — Telephone Encounter (Signed)
Spoke with patient. Advised of refill as seen below from Dr.Silva. Patient is agreeable. Recheck scheduled for 01/26/2019 at 1:30 pm. Patient is agreeable to date and time.   Routing to provider and will close encounter.

## 2019-01-01 DIAGNOSIS — M25521 Pain in right elbow: Secondary | ICD-10-CM | POA: Diagnosis not present

## 2019-01-01 DIAGNOSIS — M25539 Pain in unspecified wrist: Secondary | ICD-10-CM | POA: Diagnosis not present

## 2019-01-01 DIAGNOSIS — F909 Attention-deficit hyperactivity disorder, unspecified type: Secondary | ICD-10-CM | POA: Diagnosis not present

## 2019-01-15 ENCOUNTER — Other Ambulatory Visit: Payer: Self-pay | Admitting: Obstetrics and Gynecology

## 2019-01-15 NOTE — Telephone Encounter (Signed)
Medication refill request: vitamin d 50,000iu Last AEX:  05-08-18 Next AEX: 05-14-2019 Last MMG (if hormonal medication request): n/a Refill authorized: appt 01-26-2019 for recheck. Please approve if appropriate

## 2019-01-15 NOTE — Telephone Encounter (Signed)
Rx declined.  Will wait for results from Vit D testing 01/26/2019

## 2019-01-22 ENCOUNTER — Other Ambulatory Visit: Payer: Self-pay

## 2019-01-26 ENCOUNTER — Other Ambulatory Visit (INDEPENDENT_AMBULATORY_CARE_PROVIDER_SITE_OTHER): Payer: BC Managed Care – PPO

## 2019-01-26 ENCOUNTER — Other Ambulatory Visit: Payer: Self-pay | Admitting: *Deleted

## 2019-01-26 ENCOUNTER — Other Ambulatory Visit: Payer: Self-pay

## 2019-01-26 DIAGNOSIS — E559 Vitamin D deficiency, unspecified: Secondary | ICD-10-CM

## 2019-01-27 ENCOUNTER — Other Ambulatory Visit: Payer: Self-pay

## 2019-01-27 DIAGNOSIS — E559 Vitamin D deficiency, unspecified: Secondary | ICD-10-CM

## 2019-01-27 LAB — VITAMIN D 25 HYDROXY (VIT D DEFICIENCY, FRACTURES): Vit D, 25-Hydroxy: 24.7 ng/mL — ABNORMAL LOW (ref 30.0–100.0)

## 2019-01-27 MED ORDER — VITAMIN D (ERGOCALCIFEROL) 1.25 MG (50000 UNIT) PO CAPS: 50000.0000 [IU] | ORAL_CAPSULE | ORAL | 0 refills | Status: DC

## 2019-02-15 DIAGNOSIS — K625 Hemorrhage of anus and rectum: Secondary | ICD-10-CM | POA: Diagnosis not present

## 2019-02-15 DIAGNOSIS — R350 Frequency of micturition: Secondary | ICD-10-CM | POA: Diagnosis not present

## 2019-02-15 DIAGNOSIS — M7711 Lateral epicondylitis, right elbow: Secondary | ICD-10-CM | POA: Diagnosis not present

## 2019-02-15 DIAGNOSIS — M71339 Other bursal cyst, unspecified wrist: Secondary | ICD-10-CM | POA: Diagnosis not present

## 2019-05-14 ENCOUNTER — Ambulatory Visit: Payer: BLUE CROSS/BLUE SHIELD | Admitting: Certified Nurse Midwife

## 2019-05-14 ENCOUNTER — Other Ambulatory Visit: Payer: Self-pay

## 2019-05-18 ENCOUNTER — Other Ambulatory Visit (HOSPITAL_COMMUNITY)
Admission: RE | Admit: 2019-05-18 | Discharge: 2019-05-18 | Disposition: A | Discharge status: Home / Self Care | Payer: BLUE CROSS/BLUE SHIELD | Source: Ambulatory Visit | Attending: Obstetrics & Gynecology | Admitting: Obstetrics & Gynecology

## 2019-05-18 ENCOUNTER — Ambulatory Visit: Payer: BC Managed Care – PPO | Admitting: Obstetrics & Gynecology

## 2019-05-18 ENCOUNTER — Encounter: Payer: Self-pay | Admitting: Obstetrics & Gynecology

## 2019-05-18 ENCOUNTER — Other Ambulatory Visit: Payer: Self-pay

## 2019-05-18 VITALS — BP 104/62 | HR 68 | Temp 97.2°F | Ht 61.5 in | Wt 169.8 lb

## 2019-05-18 DIAGNOSIS — Z124 Encounter for screening for malignant neoplasm of cervix: Secondary | ICD-10-CM | POA: Diagnosis not present

## 2019-05-18 DIAGNOSIS — Z01419 Encounter for gynecological examination (general) (routine) without abnormal findings: Secondary | ICD-10-CM | POA: Diagnosis not present

## 2019-05-18 DIAGNOSIS — Z0000 Encounter for general adult medical examination without abnormal findings: Secondary | ICD-10-CM | POA: Diagnosis not present

## 2019-05-18 NOTE — Progress Notes (Signed)
38 y.o. G0P0000 Single White or Caucasian female here for annual exam.  Still considering pregnancy.  She did have consultation with Dr. April Manson for egg freezing.  Together with partner for > a year.  He is going with her to appts.   Cycles are regular but she is having increased issues with cramping on the first day and she has more emotional swings with her cycle.  Advil doesn't seem to help.  Does use heating pad.    Has noticed some hair loss this last year.    Having more emotional issues this year.  Concerned about taking something for this.    She has a hx of thyroid biopsy that showed cytological atypical cells.  This was in 2015.  Repeat biopsy was recommended.  She has not done this yet.  D/w pt importance of this.  States she will restart the process again.   She has been seen by Dr. Talmage Nap most recently.    Has noticed increased headaches this year.  She did have a headache that lasted a week within the last month.  No visual changes with headache.  She did have light sensitivity with headache.  Has been taking Advil but this didn't work.    Declines flu shot today.    Patient's last menstrual period was 05/12/2019 (approximate).          Sexually active: Yes.    The current method of family planning is condoms every time.    Exercising: Yes.    1 or 2 x weekly  Smoker:  no  Health Maintenance: Pap:  03/13/17 neg   06/13/15 neg. HR HPV:neg  History of abnormal Pap:  Yes, HR HPV + 2015  MMG:  Never TDaP:  2019 Hep C testing: 09/23/17 neg  Screening Labs: Here today - fasting    reports that she quit smoking about 7 years ago. Her smoking use included cigarettes. She has a 2.50 pack-year smoking history. She has never used smokeless tobacco. She reports current alcohol use of about 2.0 standard drinks of alcohol per week. She reports that she does not use drugs.  Past Medical History:  Diagnosis Date  . 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 STD (female) 2010   HSV I  & II  by serology  . Thyroid nodule     Past Surgical History:  Procedure Laterality Date  . CHOLECYSTECTOMY N/A 01/22/2017   Procedure: LAPAROSCOPIC CHOLECYSTECTOMY;  Surgeon: Axel Filler, MD;  Location: [facility] OR;  Service: General;  Laterality: N/A;  . WISDOM TOOTH EXTRACTION  2007    Current Outpatient Medications  Medication Sig Dispense Refill  . Vitamin D, Ergocalciferol, (DRISDOL) 1.25 MG (50000 UT) CAPS capsule TAKE 1 CAPSULE (50,000 UNITS TOTAL) BY MOUTH EVERY 7 (SEVEN) DAYS. 4 capsule 0  . VYVANSE 20 MG capsule TAKE 1 CAPSULE BY MOUTH EVERY DAY IN THE MORNING    . VYVANSE 30 MG capsule Take by mouth daily.     No current facility-administered medications for this visit.     Family History  Problem Relation Age of Onset  . Diabetes Mother   . Hyperlipidemia Mother   . Coronary artery disease Father        Had 1 stent placed  . Thyroid cancer Maternal Grandmother   . Osteoarthritis Maternal Grandmother   . Diabetes Brother     Review of Systems  Constitutional: Positive for fatigue.  HENT:       Hair loss  Neurological: Positive for headaches.  Psychiatric/Behavioral: Positive for behavioral problems.  All other systems reviewed and are negative.   Exam:   BP 104/62   Pulse 68   Temp (!) 97.2 F (36.2 C) (Temporal)   Ht 5' 1.5" (1.562 m)   Wt 169 lb 12.8 oz (77 kg)   LMP 05/12/2019 (Approximate)   BMI 31.56 kg/m   Height: 5' 1.5" (156.2 cm)  Ht Readings from Last 3 Encounters:  05/18/19 5' 1.5" (1.562 m)  08/28/18 5' 0.75" (1.543 m)  05/08/18 5' 0.75" (1.543 m)    General appearance: alert, cooperative and appears stated age Head: Normocephalic, without obvious abnormality, atraumatic Neck: no adenopathy, supple, symmetrical, trachea midline and thyroid normal to inspection and palpation Lungs: clear to auscultation bilaterally Breasts: normal appearance, no masses or tenderness Heart: regular rate and rhythm Abdomen: soft, non-tender; bowel  sounds normal; no masses,  no organomegaly Extremities: extremities normal, atraumatic, no cyanosis or edema Skin: Skin color, texture, turgor normal. No rashes or lesions Lymph nodes: Cervical, supraclavicular, and axillary nodes normal. No abnormal inguinal nodes palpated Neurologic: Grossly normal   Pelvic: External genitalia:  no lesions              Urethra:  normal appearing urethra with no masses, tenderness or lesions              Bartholins and Skenes: normal                 Vagina: normal appearing vagina with normal color and discharge, no lesions              Cervix: no lesions              Pap taken: Yes.   Bimanual Exam:  Uterus:  normal size, contour, position, consistency, mobility, non-tender              Adnexa: normal adnexa and no mass, fullness, tenderness               Rectovaginal: Confirms               Anus:  normal sphincter tone, no lesions  Chaperone was present for exam.  A:  Well Woman with normal exam H/o condom use Known thyroid nodule with biopsy in 2015 that showed cytological atypia Hair thinning Emotional changes/lability  P:   Mammogram guidelines reviewed pap smear with HR HPV obtained today CBC, CMP, TSH, iron studies, Vit D Declines treatment for mood changes States she will call for endocrinology appt Return annually or prn

## 2019-05-19 LAB — COMPREHENSIVE METABOLIC PANEL
ALT: 15 IU/L (ref 0–32)
AST: 15 IU/L (ref 0–40)
Albumin/Globulin Ratio: 1.9 (ref 1.2–2.2)
Albumin: 3.9 g/dL (ref 3.8–4.8)
Alkaline Phosphatase: 52 IU/L (ref 39–117)
BUN/Creatinine Ratio: 26 — ABNORMAL HIGH (ref 9–23)
BUN: 19 mg/dL (ref 6–20)
Bilirubin Total: <0.2 mg/dL (ref 0.0–1.2)
CO2: 21 mmol/L (ref 20–29)
Calcium: 8.6 mg/dL — ABNORMAL LOW (ref 8.7–10.2)
Chloride: 108 mmol/L — ABNORMAL HIGH (ref 96–106)
Creatinine, Ser: 0.74 mg/dL (ref 0.57–1.00)
GFR calc Af Amer: 120 mL/min/{1.73_m2} (ref >59)
GFR calc non Af Amer: 104 mL/min/{1.73_m2} (ref >59)
Globulin, Total: 2.1 g/dL (ref 1.5–4.5)
Glucose: 97 mg/dL (ref 65–99)
Potassium: 4.3 mmol/L (ref 3.5–5.2)
Sodium: 142 mmol/L (ref 134–144)
Total Protein: 6 g/dL (ref 6.0–8.5)

## 2019-05-19 LAB — IRON,TIBC AND FERRITIN PANEL
Ferritin: 31 ng/mL (ref 15–150)
Iron Saturation: 20 % (ref 15–55)
Iron: 57 ug/dL (ref 27–159)
Total Iron Binding Capacity: 286 ug/dL (ref 250–450)
UIBC: 229 ug/dL (ref 131–425)

## 2019-05-19 LAB — CBC
Hematocrit: 39.9 % (ref 34.0–46.6)
Hemoglobin: 12.4 g/dL (ref 11.1–15.9)
MCH: 25 pg — ABNORMAL LOW (ref 26.6–33.0)
MCHC: 31.1 g/dL — ABNORMAL LOW (ref 31.5–35.7)
MCV: 80 fL (ref 79–97)
Platelets: 213 10*3/uL (ref 150–450)
RBC: 4.96 x10E6/uL (ref 3.77–5.28)
RDW: 13.4 % (ref 11.7–15.4)
WBC: 6.2 10*3/uL (ref 3.4–10.8)

## 2019-05-19 LAB — TSH: TSH: 2.34 u[IU]/mL (ref 0.450–4.500)

## 2019-05-19 LAB — VITAMIN D 25 HYDROXY (VIT D DEFICIENCY, FRACTURES): Vit D, 25-Hydroxy: 23.2 ng/mL — ABNORMAL LOW (ref 30.0–100.0)

## 2019-05-26 LAB — CYTOLOGY - PAP
Comment: NEGATIVE
High risk HPV: POSITIVE — AB

## 2019-05-28 ENCOUNTER — Telehealth: Payer: Self-pay | Admitting: Obstetrics & Gynecology

## 2019-05-28 ENCOUNTER — Other Ambulatory Visit: Payer: Self-pay | Admitting: *Deleted

## 2019-05-28 DIAGNOSIS — R87612 Low grade squamous intraepithelial lesion on cytologic smear of cervix (LGSIL): Secondary | ICD-10-CM

## 2019-05-28 DIAGNOSIS — B977 Papillomavirus as the cause of diseases classified elsewhere: Secondary | ICD-10-CM

## 2019-05-28 NOTE — Telephone Encounter (Signed)
Call placed to convey benefits for colposcopy. Spoke with patient and conveyed the benefits. Patient understands/agreeble with the benefits. Patient to call at onset of cycle.

## 2019-06-14 ENCOUNTER — Other Ambulatory Visit: Payer: Self-pay

## 2019-06-14 DIAGNOSIS — Z20822 Contact with and (suspected) exposure to covid-19: Secondary | ICD-10-CM

## 2019-06-14 NOTE — Telephone Encounter (Signed)
Patient has started her cycle and is ready to have her colposcopy.

## 2019-06-14 NOTE — Telephone Encounter (Signed)
Spoke with patient. LMP 06/13/19. No contraceptive. Colpo scheduled for 11/10 at 8:30am with Dr. Sabra Heck. Advised to take Motrin 800 mg with food and water one hour before procedure. Advised to continue to abstain from intercourse until after procedure completed. Patient verbalizes understanding and is agreeable.  Patient request to review benefits again, call transferred to [facility].   Routing to provider for final review. Patient is agreeable to disposition. Will close encounter.  Cc: Lerry Liner, Magdalene Patricia

## 2019-06-15 LAB — NOVEL CORONAVIRUS, NAA: SARS-CoV-2, NAA: NOT DETECTED

## 2019-06-18 ENCOUNTER — Other Ambulatory Visit: Payer: Self-pay

## 2019-06-22 ENCOUNTER — Other Ambulatory Visit: Payer: Self-pay | Admitting: Obstetrics & Gynecology

## 2019-06-22 ENCOUNTER — Encounter: Payer: Self-pay | Admitting: Obstetrics & Gynecology

## 2019-06-22 ENCOUNTER — Ambulatory Visit: Payer: BC Managed Care – PPO | Admitting: Obstetrics & Gynecology

## 2019-06-22 ENCOUNTER — Other Ambulatory Visit: Payer: Self-pay

## 2019-06-22 VITALS — BP 118/70 | HR 68 | Temp 97.3°F | Resp 12 | Ht 61.0 in | Wt 171.2 lb

## 2019-06-22 DIAGNOSIS — B977 Papillomavirus as the cause of diseases classified elsewhere: Secondary | ICD-10-CM

## 2019-06-22 DIAGNOSIS — Z01812 Encounter for preprocedural laboratory examination: Secondary | ICD-10-CM

## 2019-06-22 DIAGNOSIS — A630 Anogenital (venereal) warts: Secondary | ICD-10-CM | POA: Diagnosis not present

## 2019-06-22 DIAGNOSIS — R87612 Low grade squamous intraepithelial lesion on cytologic smear of cervix (LGSIL): Secondary | ICD-10-CM | POA: Diagnosis not present

## 2019-06-22 LAB — POCT URINE PREGNANCY: Preg Test, Ur: NEGATIVE

## 2019-06-22 NOTE — Patient Instructions (Signed)

## 2019-06-22 NOTE — Addendum Note (Signed)
Addended by: Megan Salon on: 06/22/2019 06:42 PM   Modules accepted: Orders

## 2019-06-22 NOTE — Progress Notes (Signed)
38 y.o. Single female G0 here for colposcopy with possible biopsies and/or ECC due to LGSIL with HR HPV Pap obtained 05/18/2019.    Prior evaluation/treatment:  Treatment with cryotherapy in 2006.  H/O +HPV at that time.  Patient's last menstrual period was 06/13/2019.          Sexually active: Yes.    The current method of family planning is condoms every time.     Patient has been counseled about results and procedure.  Risks and benefits have bene reviewed including immediate and/or delayed bleeding, infection, cervical scaring from procedure, possibility of needing additional follow up as well as treatment.  rare risks of missing a lesion discussed as well.  All questions answered.  Pt ready to proceed.  BP 118/70 (BP Location: Right Arm, Patient Position: Sitting, Cuff Size: Large)   Pulse 68   Temp (!) 97.3 F (36.3 C) (Temporal)   Resp 12   Ht 5\' 1"  (1.549 m)   Wt 171 lb 3.2 oz (77.7 kg)   LMP 06/13/2019   BMI 32.35 kg/m   Physical Exam  Constitutional: She is oriented to person, place, and time. She appears well-developed and well-nourished.  Genitourinary: Uterus is not deviated, not enlarged, not fixed and not tender. Cervix exhibits no motion tenderness, no discharge and no friability. Right adnexum displays no mass, no tenderness and no fullness. Left adnexum displays no mass, no tenderness and no fullness.     Neurological: She is alert and oriented to person, place, and time.    Speculum placed.  3% acetic acid applied to cervix for >45 seconds.  Cervix visualized with both 7.5X and 15X magnification.  Green filter also used.  Lugols solution was used.  Findings:  Noted in pictures.  Biopsy:  At 1-2 o'clock.  ECC:  was performed.  Monsel's was needed.  Excellent hemostasis was present.  Pt tolerated procedure well and all instruments were removed.    Assessment:  LGSIL pap with +HR HPV  Plan:  Pathology results will be called to patient and follow-up planned pending  results.

## 2019-07-29 ENCOUNTER — Other Ambulatory Visit: Payer: Self-pay | Admitting: Certified Nurse Midwife

## 2019-08-12 ENCOUNTER — Ambulatory Visit
Admission: RE | Admit: 2019-08-12 | Discharge: 2019-08-12 | Disposition: A | Discharge status: Home / Self Care | Payer: BC Managed Care – PPO | Source: Ambulatory Visit | Attending: Internal Medicine | Admitting: Internal Medicine

## 2019-08-12 DIAGNOSIS — E041 Nontoxic single thyroid nodule: Secondary | ICD-10-CM | POA: Diagnosis not present

## 2019-08-12 DIAGNOSIS — Z808 Family history of malignant neoplasm of other organs or systems: Secondary | ICD-10-CM

## 2019-08-18 DIAGNOSIS — E041 Nontoxic single thyroid nodule: Secondary | ICD-10-CM | POA: Diagnosis not present

## 2019-08-18 DIAGNOSIS — R635 Abnormal weight gain: Secondary | ICD-10-CM | POA: Diagnosis not present

## 2019-08-18 DIAGNOSIS — Z808 Family history of malignant neoplasm of other organs or systems: Secondary | ICD-10-CM | POA: Diagnosis not present

## 2019-08-18 DIAGNOSIS — R5383 Other fatigue: Secondary | ICD-10-CM | POA: Diagnosis not present

## 2019-08-19 ENCOUNTER — Other Ambulatory Visit: Payer: Self-pay | Admitting: Internal Medicine

## 2019-08-19 DIAGNOSIS — E041 Nontoxic single thyroid nodule: Secondary | ICD-10-CM

## 2019-08-25 ENCOUNTER — Ambulatory Visit
Admission: RE | Admit: 2019-08-25 | Discharge: 2019-08-25 | Disposition: A | Discharge status: Home / Self Care | Payer: BC Managed Care – PPO | Source: Ambulatory Visit | Attending: Internal Medicine | Admitting: Internal Medicine

## 2019-08-25 ENCOUNTER — Other Ambulatory Visit: Payer: BC Managed Care – PPO

## 2019-08-25 ENCOUNTER — Other Ambulatory Visit (HOSPITAL_COMMUNITY)
Admission: RE | Admit: 2019-08-25 | Discharge: 2019-08-25 | Disposition: A | Discharge status: Home / Self Care | Payer: BC Managed Care – PPO | Source: Ambulatory Visit | Attending: Radiology | Admitting: Radiology

## 2019-08-25 DIAGNOSIS — E041 Nontoxic single thyroid nodule: Secondary | ICD-10-CM | POA: Diagnosis not present

## 2019-08-25 DIAGNOSIS — R635 Abnormal weight gain: Secondary | ICD-10-CM | POA: Diagnosis not present

## 2019-08-25 DIAGNOSIS — R5383 Other fatigue: Secondary | ICD-10-CM | POA: Diagnosis not present

## 2019-08-27 LAB — CYTOLOGY - NON PAP

## 2019-09-15 DIAGNOSIS — E041 Nontoxic single thyroid nodule: Secondary | ICD-10-CM | POA: Diagnosis not present

## 2019-09-15 DIAGNOSIS — R5383 Other fatigue: Secondary | ICD-10-CM | POA: Diagnosis not present

## 2019-09-15 DIAGNOSIS — R635 Abnormal weight gain: Secondary | ICD-10-CM | POA: Diagnosis not present

## 2019-09-15 DIAGNOSIS — Z808 Family history of malignant neoplasm of other organs or systems: Secondary | ICD-10-CM | POA: Diagnosis not present

## 2019-09-21 ENCOUNTER — Encounter (HOSPITAL_COMMUNITY): Payer: Self-pay

## 2019-10-29 ENCOUNTER — Encounter: Payer: Self-pay | Admitting: Certified Nurse Midwife

## 2020-05-29 NOTE — Progress Notes (Deleted)
39 y.o. G0P0000 Single White or Caucasian female here for annual exam.    No LMP recorded.          Sexually active: {yes no:314532}  The current method of family planning is {contraception:315051}.    Exercising: {yes KV:425956}  {types:19826} Smoker:  {YES NO:22349}  Health Maintenance: Pap:  03-13-17 neg, 05-18-2019 LGSIL HPV HR+ History of abnormal Pap:  yes MMG:  none Colonoscopy:  none BMD:   none TDaP:  2019 Pneumonia vaccine(s):  no Shingrix:   no Hep C testing: neg 2019 Screening Labs: ***   reports that she quit smoking about 8 years ago. Her smoking use included cigarettes. She has a 2.50 pack-year smoking history. She has never used smokeless tobacco. She reports current alcohol use of about 2.0 standard drinks of alcohol per week. She reports that she does not use drugs.  Past Medical History:  Diagnosis Date  . 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 STD (female) 2010   HSV I & II  by serology  . Thyroid nodule     Past Surgical History:  Procedure Laterality Date  . CHOLECYSTECTOMY N/A 01/22/2017   Procedure: LAPAROSCOPIC CHOLECYSTECTOMY;  Surgeon: Axel Filler, MD;  Location: [facility] OR;  Service: General;  Laterality: N/A;  . WISDOM TOOTH EXTRACTION  2007    Current Outpatient Medications  Medication Sig Dispense Refill  . Vitamin D, Ergocalciferol, (DRISDOL) 1.25 MG (50000 UT) CAPS capsule TAKE 1 CAPSULE (50,000 UNITS TOTAL) BY MOUTH EVERY 7 (SEVEN) DAYS. 4 capsule 0  . VYVANSE 20 MG capsule TAKE 1 CAPSULE BY MOUTH EVERY DAY IN THE MORNING    . VYVANSE 30 MG capsule Take by mouth daily.     No current facility-administered medications for this visit.    Family History  Problem Relation Age of Onset  . Diabetes Mother   . Hyperlipidemia Mother   . Coronary artery disease Father        Had 1 stent placed  . Thyroid cancer Maternal Grandmother   . Osteoarthritis Maternal Grandmother   . Diabetes Brother     Review of Systems  Exam:   There  were no vitals taken for this visit.     General appearance: alert, cooperative and appears stated age Head: Normocephalic, without obvious abnormality, atraumatic Neck: no adenopathy, supple, symmetrical, trachea midline and thyroid {EXAM; THYROID:18604} Lungs: clear to auscultation bilaterally Breasts: {Exam; breast:13139::"normal appearance, no masses or tenderness"} Heart: regular rate and rhythm Abdomen: soft, non-tender; bowel sounds normal; no masses,  no organomegaly Extremities: extremities normal, atraumatic, no cyanosis or edema Skin: Skin color, texture, turgor normal. No rashes or lesions Lymph nodes: Cervical, supraclavicular, and axillary nodes normal. No abnormal inguinal nodes palpated Neurologic: Grossly normal   Pelvic: External genitalia:  no lesions              Urethra:  normal appearing urethra with no masses, tenderness or lesions              Bartholins and Skenes: normal                 Vagina: normal appearing vagina with normal color and discharge, no lesions              Cervix: {exam; cervix:14595}              Pap taken: {yes no:314532} Bimanual Exam:  Uterus:  {exam; uterus:12215}  Adnexa: {exam; adnexa:12223}               Rectovaginal: Confirms               Anus:  normal sphincter tone, no lesions  Chaperone, ***Terence Lux, CMA, was present for exam.  A:  Well Woman with normal exam  P:   {plan; gyn:5269::"mammogram","pap smear","return annually or prn"}

## 2020-06-01 ENCOUNTER — Ambulatory Visit: Payer: Medicaid Other | Admitting: Obstetrics & Gynecology

## 2020-06-01 ENCOUNTER — Telehealth: Payer: Self-pay

## 2020-06-01 NOTE — Telephone Encounter (Signed)
Left message to call Tauheed Mcfayden, RN at [facility] [phone].   

## 2020-06-01 NOTE — Telephone Encounter (Signed)
Patient called stating she was not going to make the appointment today.

## 2020-06-01 NOTE — Telephone Encounter (Signed)
Routing to triage.  Pt is in recall for LGSIL pap smear.  She cancelled her 8am appt today at 8:22.  Please call to reschedule.

## 2020-06-05 NOTE — Telephone Encounter (Signed)
Spoke with patient.  AEX r/s to 06/08/20 at 1pm w/ Dr. Hyacinth Meeker.  Call transferred to front office to update insurance.  Patient agreeable to date and time.   Encounter closed.

## 2020-06-06 NOTE — Progress Notes (Signed)
39 y.o. G0P0000 Single White or Caucasian female here for annual exam.  She did have her thyroid biopsied in January.  Ultrasound showed nodule was smaller.  Biopsy showed atypical cells.  Follow up is planned for a year.  Has blood work done.    Has noticed some LLQ pain that she's noticed with sneezing and when take a step up.  It's not present all of the time but just these specific times.    Cycles have come a little early during the past year.  They've been as short as 20-24 days between cycles.  Some have been more normal.  Has experienced post coital bleeding as well.  Same partner for the last three years.  Wondering if this is going to last.  Has some questions about freezing eggs.  Did see Dr. April Manson for this.  Called for follow up last year but didn't get a call back.  Highly encouraged pt, given age, to pursue this now or in 2022 if this is something she actually wants to doe.    Would like Vit D level done today.  Patient's last menstrual period was 05/20/2020 (exact date).          Sexually active: Yes.    The current method of family planning is condoms most of the time.    Exercising: No.  exercise Smoker:  no  Health Maintenance: Pap:  03-13-17 neg, 05-18-2019 LGSIL HPV HR+ History of abnormal Pap:  yes MMG:  none Colonoscopy:  none BMD:   none TDaP:  2019 Pneumonia vaccine(s):  no Shingrix:   no Hep C testing: neg 2019 Screening Labs: cmp   reports that she quit smoking about 8 years ago. Her smoking use included cigarettes. She has a 2.50 pack-year smoking history. She has never used smokeless tobacco. She reports current alcohol use of about 2.0 standard drinks of alcohol per week. She reports that she does not use drugs.  Past Medical History:  Diagnosis Date  . 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 STD (female) 2010   HSV I & II  by serology  . Thyroid nodule     Past Surgical History:  Procedure Laterality Date  . CHOLECYSTECTOMY N/A 01/22/2017    Procedure: LAPAROSCOPIC CHOLECYSTECTOMY;  Surgeon: Axel Filler, MD;  Location: [facility] OR;  Service: General;  Laterality: N/A;  . WISDOM TOOTH EXTRACTION  2007    Current Outpatient Medications  Medication Sig Dispense Refill  . Multiple Vitamins-Minerals (ZINC PO) Take by mouth.    Marland Kitchen UNABLE TO FIND Vitamin c packets    . VYVANSE 20 MG capsule TAKE 1 CAPSULE BY MOUTH EVERY DAY IN THE MORNING     No current facility-administered medications for this visit.    Family History  Problem Relation Age of Onset  . Diabetes Mother   . Hyperlipidemia Mother   . Coronary artery disease Father        Had 1 stent placed  . Thyroid cancer Maternal Grandmother   . Osteoarthritis Maternal Grandmother   . Diabetes Brother     Review of Systems  Constitutional: Negative.   HENT: Negative.   Eyes: Negative.   Respiratory: Negative.   Cardiovascular: Negative.   Gastrointestinal: Negative.   Endocrine: Negative.   Genitourinary:       Vaginal spotting after sex  Musculoskeletal: Negative.   Skin: Negative.   Allergic/Immunologic: Negative.   Neurological: Negative.   Hematological: Negative.   Psychiatric/Behavioral: Negative.  Exam:   BP 110/70   Pulse 70   Resp 16   Ht 5' 1.25" (1.556 m)   Wt 174 lb (78.9 kg)   LMP 05/20/2020 (Exact Date)   BMI 32.61 kg/m   Height: 5' 1.25" (155.6 cm)  General appearance: alert, cooperative and appears stated age Head: Normocephalic, without obvious abnormality, atraumatic Neck: no adenopathy, supple, symmetrical, trachea midline and thyroid normal to inspection and palpation Lungs: clear to auscultation bilaterally Breasts: normal appearance, no masses or tenderness Heart: regular rate and rhythm Abdomen: soft, non-tender; bowel sounds normal; no masses,  no organomegaly Extremities: extremities normal, atraumatic, no cyanosis or edema Skin: Skin color, texture, turgor normal. No rashes or lesions Lymph nodes: Cervical,  supraclavicular, and axillary nodes normal. No abnormal inguinal nodes palpated Neurologic: Grossly normal   Pelvic: External genitalia:  no lesions              Urethra:  normal appearing urethra with no masses, tenderness or lesions              Bartholins and Skenes: normal                 Vagina: normal appearing vagina with normal color and discharge, no lesions              Cervix: no lesions              Pap taken: Yes.   Bimanual Exam:  Uterus:  normal size, contour, position, consistency, mobility, non-tender              Adnexa: normal adnexa and no mass, fullness, tenderness               Rectovaginal: Confirms               Anus:  normal sphincter tone, no lesions  Chaperone, Zenovia Jordan, CMA, was present for exam.  A:  Well Woman with normal exam Thyroid nodule with biopsy showing cytologic atypic of undetermined significant (most recently 08/25/2019) followed by endocrinology.  Repeat thyroid ultrasound planned for early next year H/o LGSIL pap and CIN 1/+HR HPV Polymenorrhea and post coital bleeding Considering freezing eggs H/o HSV I/II by resolovy  P:   Mammogram guidelines reviewed.  Not indicated yet pap smear with HR HPV obtained today Vit D and CMP obtained today Pt encouraged to follow up with Dr. April Manson  return annually or prn

## 2020-06-08 ENCOUNTER — Other Ambulatory Visit: Payer: Self-pay

## 2020-06-08 ENCOUNTER — Other Ambulatory Visit (HOSPITAL_COMMUNITY)
Admission: RE | Admit: 2020-06-08 | Discharge: 2020-06-08 | Disposition: A | Discharge status: Home / Self Care | Payer: Medicaid Other | Source: Ambulatory Visit | Attending: Obstetrics & Gynecology | Admitting: Obstetrics & Gynecology

## 2020-06-08 ENCOUNTER — Ambulatory Visit: Payer: Medicaid Other | Admitting: Obstetrics & Gynecology

## 2020-06-08 ENCOUNTER — Encounter: Payer: Self-pay | Admitting: Obstetrics & Gynecology

## 2020-06-08 VITALS — BP 110/70 | HR 70 | Resp 16 | Ht 61.25 in | Wt 174.0 lb

## 2020-06-08 DIAGNOSIS — Z01419 Encounter for gynecological examination (general) (routine) without abnormal findings: Secondary | ICD-10-CM | POA: Diagnosis not present

## 2020-06-08 DIAGNOSIS — R87612 Low grade squamous intraepithelial lesion on cytologic smear of cervix (LGSIL): Secondary | ICD-10-CM | POA: Diagnosis not present

## 2020-06-08 DIAGNOSIS — R7989 Other specified abnormal findings of blood chemistry: Secondary | ICD-10-CM

## 2020-06-08 DIAGNOSIS — N898 Other specified noninflammatory disorders of vagina: Secondary | ICD-10-CM | POA: Insufficient documentation

## 2020-06-08 DIAGNOSIS — B977 Papillomavirus as the cause of diseases classified elsewhere: Secondary | ICD-10-CM | POA: Diagnosis not present

## 2020-06-08 DIAGNOSIS — E559 Vitamin D deficiency, unspecified: Secondary | ICD-10-CM

## 2020-06-08 DIAGNOSIS — Z124 Encounter for screening for malignant neoplasm of cervix: Secondary | ICD-10-CM | POA: Diagnosis not present

## 2020-06-08 DIAGNOSIS — Z113 Encounter for screening for infections with a predominantly sexual mode of transmission: Secondary | ICD-10-CM | POA: Insufficient documentation

## 2020-06-09 LAB — COMPREHENSIVE METABOLIC PANEL
ALT: 28 IU/L (ref 0–32)
AST: 20 IU/L (ref 0–40)
Albumin/Globulin Ratio: 2 (ref 1.2–2.2)
Albumin: 4.7 g/dL (ref 3.8–4.8)
Alkaline Phosphatase: 66 IU/L (ref 44–121)
BUN/Creatinine Ratio: 15 (ref 9–23)
BUN: 11 mg/dL (ref 6–20)
Bilirubin Total: 0.5 mg/dL (ref 0.0–1.2)
CO2: 22 mmol/L (ref 20–29)
Calcium: 9.6 mg/dL (ref 8.7–10.2)
Chloride: 103 mmol/L (ref 96–106)
Creatinine, Ser: 0.72 mg/dL (ref 0.57–1.00)
GFR calc Af Amer: 123 mL/min/{1.73_m2} (ref >59)
GFR calc non Af Amer: 107 mL/min/{1.73_m2} (ref >59)
Globulin, Total: 2.4 g/dL (ref 1.5–4.5)
Glucose: 94 mg/dL (ref 65–99)
Potassium: 4.9 mmol/L (ref 3.5–5.2)
Sodium: 137 mmol/L (ref 134–144)
Total Protein: 7.1 g/dL (ref 6.0–8.5)

## 2020-06-09 LAB — VITAMIN D 25 HYDROXY (VIT D DEFICIENCY, FRACTURES): Vit D, 25-Hydroxy: 19.8 ng/mL — ABNORMAL LOW (ref 30.0–100.0)

## 2020-06-11 ENCOUNTER — Encounter: Payer: Self-pay | Admitting: Obstetrics & Gynecology

## 2020-06-12 ENCOUNTER — Other Ambulatory Visit: Payer: Self-pay

## 2020-06-12 LAB — CYTOLOGY - PAP
Adequacy: ABSENT
Chlamydia: NEGATIVE
Comment: NEGATIVE
Comment: NEGATIVE
Comment: NEGATIVE
Comment: NORMAL
Diagnosis: UNDETERMINED — AB
High risk HPV: NEGATIVE
Neisseria Gonorrhea: NEGATIVE
Trichomonas: NEGATIVE

## 2020-06-12 MED ORDER — VITAMIN D (ERGOCALCIFEROL) 1.25 MG (50000 UNIT) PO CAPS: 50000.0000 [IU] | ORAL_CAPSULE | ORAL | 0 refills | Status: DC

## 2020-06-13 ENCOUNTER — Encounter: Payer: Self-pay | Admitting: Obstetrics & Gynecology

## 2020-06-13 ENCOUNTER — Telehealth: Payer: Self-pay

## 2020-06-13 NOTE — Telephone Encounter (Signed)
Patient is calling to discuss results. Patient stated she can be reached after 11:30am.

## 2020-06-13 NOTE — Telephone Encounter (Signed)
Spoke with pt. Pt given results and recommendations per Dr Hyacinth Meeker. Pt agreeable and verbalized understanding. Pt states will transfer care to new location and will see Dr Hyacinth Meeker next year for AEX in 05/2021. Encounter closed  Jerene Bears, MD  Isabell Jarvis, RN Please let pt know her pap showed ASCUS findings. This is better than last year when she had LGSIL findings. Also, the high risk HPV testing this year was negative. According to the current guidelines, she needs repeat HR HPV based testing in 1 year. So, needs 12 month recall. Colposcopy is not needed at this time. Her GC/Chl, trich testing was negative as well.   CC: Zenovia Jordan, CMA

## 2020-06-13 NOTE — Telephone Encounter (Signed)
Result note routed to you.  Thanks. 

## 2020-06-13 NOTE — Telephone Encounter (Signed)
Spoke with pt. Pt states seeing Pap results from 06/08/20 and is concerned about cervical cancer. Pt states also when had exam, Dr Hyacinth Meeker stated her "cervix felt weird" .  Advised will review with Dr Hyacinth Meeker and return call with recommendations. Pt agreeable.   Pt has h/o LGSIL pap with + HPV 05/2019, Had colpo in 06/2019-neg.   Routing to Dr Lesli Albee advise

## 2020-08-17 ENCOUNTER — Ambulatory Visit: Payer: BC Managed Care – PPO | Admitting: Obstetrics & Gynecology

## 2020-09-08 ENCOUNTER — Other Ambulatory Visit: Payer: Self-pay | Admitting: Obstetrics & Gynecology

## 2020-10-09 ENCOUNTER — Other Ambulatory Visit: Payer: Self-pay

## 2020-10-24 ENCOUNTER — Other Ambulatory Visit (HOSPITAL_BASED_OUTPATIENT_CLINIC_OR_DEPARTMENT_OTHER): Payer: Self-pay

## 2020-10-24 ENCOUNTER — Other Ambulatory Visit (HOSPITAL_BASED_OUTPATIENT_CLINIC_OR_DEPARTMENT_OTHER)
Admission: RE | Admit: 2020-10-24 | Discharge: 2020-10-24 | Disposition: A | Discharge status: Home / Self Care | Payer: 59 | Source: Ambulatory Visit | Attending: Family Medicine | Admitting: Family Medicine

## 2020-10-24 ENCOUNTER — Encounter (HOSPITAL_BASED_OUTPATIENT_CLINIC_OR_DEPARTMENT_OTHER): Payer: Self-pay | Admitting: Family Medicine

## 2020-10-24 ENCOUNTER — Other Ambulatory Visit: Payer: Self-pay

## 2020-10-24 ENCOUNTER — Ambulatory Visit (HOSPITAL_BASED_OUTPATIENT_CLINIC_OR_DEPARTMENT_OTHER): Payer: 59 | Admitting: Family Medicine

## 2020-10-24 VITALS — BP 126/80 | HR 65 | Ht 61.0 in | Wt 181.6 lb

## 2020-10-24 DIAGNOSIS — E559 Vitamin D deficiency, unspecified: Secondary | ICD-10-CM | POA: Diagnosis present

## 2020-10-24 DIAGNOSIS — Z6834 Body mass index (BMI) 34.0-34.9, adult: Secondary | ICD-10-CM | POA: Diagnosis not present

## 2020-10-24 DIAGNOSIS — F909 Attention-deficit hyperactivity disorder, unspecified type: Secondary | ICD-10-CM

## 2020-10-24 DIAGNOSIS — G4700 Insomnia, unspecified: Secondary | ICD-10-CM

## 2020-10-24 LAB — VITAMIN D 25 HYDROXY (VIT D DEFICIENCY, FRACTURES): Vit D, 25-Hydroxy: 32.84 ng/mL (ref 30–100)

## 2020-10-24 MED ORDER — LISDEXAMFETAMINE DIMESYLATE 10 MG PO CAPS: 10.0000 mg | ORAL_CAPSULE | Freq: Every day | ORAL | 0 refills | Status: DC

## 2020-10-24 NOTE — Addendum Note (Signed)
Addended by: Dareen Piano on: 10/24/2020 12:52 PM   Modules accepted: Orders

## 2020-10-24 NOTE — Assessment & Plan Note (Signed)
Has been working on lifestyle modifications, encouraged to continue with these Discussed appropriate exercise regimen Discussed dietary considerations, some improvements could be made in regards to her weekly dietary routine We will continue to monitor her weight at subsequent visits

## 2020-10-24 NOTE — Assessment & Plan Note (Signed)
Controlled with Vyvanse She is at a lower dose, however it is possible that this is impacting her insomnia as above We will try a dose of 10 mg and monitor impact on daily activities as well as insomnia concerns If negative impact on daily activities and focus occurs without significant benefit in sleep quality then likely transition back to 20 mg dose

## 2020-10-24 NOTE — Assessment & Plan Note (Signed)
New problem for patient Uncertain if this may be side effect of Vyvanse, will decrease dose and monitor impact to daily activities and impact on sleep Provided handout covering sleep hygiene guidelines Discussed importance of regular exercise, timing of exercise, bedtime routine, avoidance of daytime naps, impact of caffeine, timing of meals Recommended that she continue with counseling that she already has in place We will follow-up at next visit

## 2020-10-24 NOTE — Assessment & Plan Note (Signed)
Vitamin D level found to be 19.8 on most recent labs in October 2021. Has been taking vitamin D 50,000 units weekly, does endorse occasional missed dose Will check vitamin D levels today

## 2020-10-24 NOTE — Progress Notes (Signed)
New Patient Office Visit  Subjective:  Patient ID: Robin Burke, female    DOB: [birth year]  Age: 40 y.o. MRN: [id number]  CC:  Chief Complaint  Patient presents with  . Insomnia  . Vit D deficiency  . ADHD    HPI Robin Burke is a 40 year old female presenting to establish in clinic.  Does have some concerns today regarding insomnia.  Reports past medical history of vitamin D deficiency and ADHD.  Insomnia: Has noted trouble with falling asleep as well as some issues with sleep maintenance over the past month.  Reports this occurs about 3-4 nights per week.  Does feel that she has a decent bedtime routine.  Has been trying to incorporate more exercise over the past month with about 5-6 sessions per week.  Does endorse some daytime napping as she tends to feel tired during the day.  Caffeine intake is primarily related to coughing in the morning, denies any afternoon or evening caffeine.  Does have tea at night, but reports that this is chamomile or peppermint tea and does not believe it has caffeine in it.  Has been doing some counseling through [facility].  Vitamin D deficiency: Has noted on several prior labs, with most recent being October 2021 with Dr. Hyacinth Meeker.  Has been taking vitamin D 50,000 units once weekly since labs in October.  Does endorse occasional missed dose.  Was scheduled to have follow-up labs drawn but this has not been done as of yet.  ADHD: Has been managed on Vyvanse 20 mg daily.  Does not take every day, more so when it is a day that she feels like she would need to take it.  Uncertain of present dosing may be affecting her sleep quality at night.  Reports that she has been taking Vyvanse for about 5 to 6 years.  Was initially started on 30 mg dose and then decreased to 20 mg dose.  Past Medical History:  Diagnosis Date  . 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 STD (female) 2010   HSV I & II  by serology  . Thyroid nodule     Past  Surgical History:  Procedure Laterality Date  . CHOLECYSTECTOMY N/A 01/22/2017   Procedure: LAPAROSCOPIC CHOLECYSTECTOMY;  Surgeon: Axel Filler, MD;  Location: [facility] OR;  Service: General;  Laterality: N/A;  . WISDOM TOOTH EXTRACTION  2007    Family History  Problem Relation Age of Onset  . Diabetes Mother   . Hyperlipidemia Mother   . Coronary artery disease Father        Had 1 stent placed  . Thyroid cancer Maternal Grandmother   . Osteoarthritis Maternal Grandmother   . Diabetes Brother     Social History   Socioeconomic History  . Marital status: Single    Spouse name: Not on file  . Number of children: 0  . Years of education: Not on file  . Highest education level: Not on file  Occupational History  . Occupation: Event planner    Comment: [employer]  Tobacco Use  . Smoking status: Former Smoker    Packs/day: 0.25    Years: 10.00    Pack years: 2.50    Types: Cigarettes    Quit date: 09/13/2011    Years since quitting: 9.1  . Smokeless tobacco: Never Used  Vaping Use  . Vaping Use: Never used  Substance and Sexual Activity  . Alcohol use: Yes    Alcohol/week: 2.0 standard drinks  Types: 2 Glasses of wine per week  . Drug use: No  . Sexual activity: Yes    Partners: Male    Birth control/protection: Condom  Other Topics Concern  . Not on file  Social History Narrative   She moved to [place] one half years ago. She previously lived in [place] and in [place].   She is half [place] and half [place].   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 Strain: Not on [employer] Insecurity: Not on file  Transportation Needs: Not on file  Physical Activity: Not on file  Stress: Not on file  Social Connections: Not on file  Intimate Partner Violence: Not on file    Objective:   Today's Vitals: BP 126/80   Pulse 65   Ht 5\' 1"  (1.549 m)   Wt 181 lb 9.6 oz (82.4 kg)   SpO2 98%   BMI 34.31 kg/m   Physical  Exam  Pleasant 40 year old female in no acute distress Cardiovascular exam with regular rate and rhythm, no murmurs appreciated Lungs clear to auscultation bilaterally  Assessment & Plan:   Problem List Items Addressed This Visit      Other   Vitamin D deficiency - Primary    Vitamin D level found to be 19.8 on most recent labs in October 2021. Has been taking vitamin D 50,000 units weekly, does endorse occasional missed dose Will check vitamin D levels today      Insomnia    New problem for patient Uncertain if this may be side effect of Vyvanse, will decrease dose and monitor impact to daily activities and impact on sleep Provided handout covering sleep hygiene guidelines Discussed importance of regular exercise, timing of exercise, bedtime routine, avoidance of daytime naps, impact of caffeine, timing of meals Recommended that she continue with counseling that she already has in place We will follow-up at next visit      BMI 34.0-34.9,adult    Has been working on lifestyle modifications, encouraged to continue with these Discussed appropriate exercise regimen Discussed dietary considerations, some improvements could be made in regards to her weekly dietary routine We will continue to monitor her weight at subsequent visits      ADHD    Controlled with Vyvanse She is at a lower dose, however it is possible that this is impacting her insomnia as above We will try a dose of 10 mg and monitor impact on daily activities as well as insomnia concerns If negative impact on daily activities and focus occurs without significant benefit in sleep quality then likely transition back to 20 mg dose         Outpatient Encounter Medications as of 10/24/2020  Medication Sig  . lisdexamfetamine (VYVANSE) 10 MG capsule Take 1 capsule (10 mg total) by mouth daily.  . Multiple Vitamins-Minerals (ZINC PO) Take by mouth.  10/26/2020 UNABLE TO FIND Vitamin c packets  . Vitamin D, Ergocalciferol,  (DRISDOL) 1.25 MG (50000 UNIT) CAPS capsule Take 1 capsule (50,000 Units total) by mouth every 7 (seven) days.  Marland Kitchen VYVANSE 20 MG capsule TAKE 1 CAPSULE BY MOUTH EVERY DAY IN THE MORNING   No facility-administered encounter medications on file as of 10/24/2020.   Spent 60 minutes on this patient encounter, including preparation, chart review, face-to-face counseling with patient and coordination of care, and documentation of encounter  Follow-up: Return in about 3 months (around 01/24/2021), or weight management and sleep, for follow up -  in office.   Jaxsyn Azam J De Peru, MD

## 2020-10-24 NOTE — Patient Instructions (Signed)
° °  Medication Instructions:  Your physician recommends that you continue on your current medications as directed. Please refer to the Current Medication list given to you today. --If you need a refill on any your medications before your next appointment, please call your pharmacy first. If no refills are authorized on file call the office.--  Lab Work: Your physician has recommended that you have lab work today: Vitamin D  If you have labs (blood work) drawn today and your tests are completely normal, you will receive your results only by:  MyChart Message (if you have MyChart) OR  A phone call from our staff. Please ensure you check your voicemail in the event that you authorized detailed messages to be left on a delegated number. If you have any lab test that is abnormal or we need to change your treatment, we will call you to review the results.  Procedures/Imaging: None Ordered  Follow-Up: Your next appointment:   Your physician recommends that you schedule a follow-up appointment in: 3 MONTHS with Dr. De Peru  We recommend signing up for the patient portal called "MyChart".  Sign up information is provided on this After Visit Summary.  MyChart is used to connect with patients for Virtual Visits (Telemedicine).  Patients are able to view lab/test results, encounter notes, upcoming appointments, etc.  Non-urgent messages can be sent to your provider as well.   To learn more about what you can do with MyChart, go to [email or web address].    Thanks for letting us be apart of your health journey!!  Primary Care and Sports Medicine    Dr. de Peru and Shawna Clamp, DNP, AGNP

## 2020-10-25 ENCOUNTER — Telehealth (HOSPITAL_BASED_OUTPATIENT_CLINIC_OR_DEPARTMENT_OTHER): Payer: Self-pay

## 2020-10-25 ENCOUNTER — Encounter (HOSPITAL_BASED_OUTPATIENT_CLINIC_OR_DEPARTMENT_OTHER): Payer: Self-pay

## 2020-10-25 MED ORDER — VITAMIN D3 250 MCG (10000 UT) PO CAPS: 10000.0000 [IU] | ORAL_CAPSULE | ORAL | 5 refills | Status: DC

## 2020-10-25 NOTE — Telephone Encounter (Signed)
Per recommendations will send RX in for 81017 IU weekly of vitamin D.  Pt is aware and agreeable

## 2020-10-25 NOTE — Telephone Encounter (Signed)
-----   Message from Hosie Poisson Peru, MD sent at 10/25/2020 11:42 AM EDT ----- May continue with weekly dosing, but would decrease to 10,000 international units dosage ----- Message ----- From: Rebbeca Paul, CMA Sent: 10/25/2020  11:38 AM EDT To: Raymond J de Peru, MD  Called patient to discuss lab results and recommendations. Pt states she would prefer to keep once weekly oral supplement rather then once daily. Please advise

## 2020-12-06 ENCOUNTER — Encounter (HOSPITAL_COMMUNITY): Payer: Self-pay | Admitting: Emergency Medicine

## 2020-12-06 ENCOUNTER — Ambulatory Visit (HOSPITAL_COMMUNITY)
Admission: EM | Admit: 2020-12-06 | Discharge: 2020-12-06 | Disposition: A | Discharge status: Home / Self Care | Payer: 59

## 2020-12-06 ENCOUNTER — Other Ambulatory Visit: Payer: Self-pay

## 2020-12-06 ENCOUNTER — Encounter (HOSPITAL_BASED_OUTPATIENT_CLINIC_OR_DEPARTMENT_OTHER): Payer: Self-pay | Admitting: Family Medicine

## 2020-12-06 DIAGNOSIS — R1031 Right lower quadrant pain: Secondary | ICD-10-CM | POA: Diagnosis not present

## 2020-12-06 NOTE — Discharge Instructions (Signed)
You need to go to the Emergency Department if you have any worsening pain, nausea, vomiting, diarrhea, or develop a fever.

## 2020-12-06 NOTE — ED Triage Notes (Signed)
Patient complains of abdominal pain that started about  6:30 pm  Pain is in lower right abdomen.  No burning or pain with urination.  Denies vaginal discharge.  Last BM was today, normal per patient.

## 2020-12-06 NOTE — ED Provider Notes (Signed)
[facility]    CSN: [id number] Arrival date & time: 12/06/20  1938      History   Chief Complaint Chief Complaint  Patient presents with  . Abdominal Pain    HPI Robin Burke is a 40 y.o. female.   Patient here for evaluation of right lower quadrant abdominal pain that started at approximately 6:30 PM.  Denies any nausea, vomiting, diarrhea.  Denies any dysuria, urgency, vaginal pain, or discharge.  Reports having a normal bowel movement earlier today.  Has not taken any OTC medications or treatments. Denies any trauma, injury, or other precipitating event.  Denies any specific alleviating or aggravating factors.  Denies any fevers, chest pain, shortness of breath, N/V/D, numbness, tingling, weakness, abdominal pain, or headaches.   ROS: As per HPI, all other pertinent ROS negative   The history is provided by the patient.  Abdominal Pain   Past Medical History:  Diagnosis Date  . 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 STD (female) 2010   HSV I & II  by serology  . Thyroid nodule     Patient Active Problem List   Diagnosis Date Noted  . Insomnia 10/24/2020  . BMI 34.0-34.9,adult 10/24/2020  . ADHD 10/24/2020  . Vitamin D deficiency 10/28/2016  . Chronic fatigue 12/31/2013  . Thyroid nodule 12/31/2013    Past Surgical History:  Procedure Laterality Date  . CHOLECYSTECTOMY N/A 01/22/2017   Procedure: LAPAROSCOPIC CHOLECYSTECTOMY;  Surgeon: Axel Filler, MD;  Location: [facility] OR;  Service: General;  Laterality: N/A;  . WISDOM TOOTH EXTRACTION  2007    OB History    Gravida  0   Para  0   Term  0   Preterm  0   AB  0   Living  0     SAB  0   IAB  0   Ectopic  0   Multiple  0   Live Births  0            Home Medications    Prior to Admission medications   Medication Sig Start Date End Date Taking? Authorizing Provider  Cholecalciferol (VITAMIN D3) 250 MCG (10000 UT) capsule Take 1 capsule (10,000 Units total) by  mouth once a week. 10/25/20  Yes de Peru, Raymond J, MD  lisdexamfetamine (VYVANSE) 10 MG capsule Take 1 capsule (10 mg total) by mouth daily. 10/24/20  Yes de Peru, Raymond J, MD  Multiple Vitamins-Minerals (ZINC PO) Take by mouth.   Yes [provider]  UNABLE TO FIND Vitamin c packets    [provider]  VYVANSE 20 MG capsule TAKE 1 CAPSULE BY MOUTH EVERY DAY IN THE MORNING Patient not taking: Reported on 12/06/2020 01/01/19   [provider]    Family History Family History  Problem Relation Age of Onset  . Diabetes Mother   . Hyperlipidemia Mother   . Coronary artery disease Father        Had 1 stent placed  . Thyroid cancer Maternal Grandmother   . Osteoarthritis Maternal Grandmother   . Diabetes Brother     Social History Social History   Tobacco Use  . Smoking status: Former Smoker    Packs/day: 0.25    Years: 10.00    Pack years: 2.50    Types: Cigarettes    Quit date: 09/13/2011    Years since quitting: 9.2  . Smokeless tobacco: Never Used  Vaping Use  . Vaping Use: Never used  Substance Use Topics  . Alcohol use: Yes    Alcohol/week: 2.0 standard drinks    Types: 2 Glasses of wine per week  . Drug use: No     Allergies   Patient has no known allergies.   Review of Systems Review of Systems  Gastrointestinal: Positive for abdominal pain.  All other systems reviewed and are negative.    Physical Exam Triage Vital Signs ED Triage Vitals  Enc Vitals Group     BP 12/06/20 2014 127/64     Pulse Rate 12/06/20 2014 68     Resp 12/06/20 2014 20     Temp 12/06/20 2014 98.1 F (36.7 C)     Temp Source 12/06/20 2014 Oral     SpO2 12/06/20 2014 99 %     Weight --      Height --      Head Circumference --      Peak Flow --      Pain Score 12/06/20 2011 6     Pain Loc --      Pain Edu? --      Excl. in GC? --    No data found.  Updated Vital Signs BP 127/64 (BP Location: Left Arm)   Pulse 68   Temp 98.1 F (36.7 C) (Oral)    Resp 20   LMP 11/24/2020   SpO2 99%   Visual Acuity Right Eye Distance:   Left Eye Distance:   Bilateral Distance:    Right Eye Near:   Left Eye Near:    Bilateral Near:     Physical Exam Vitals and nursing note reviewed.  Constitutional:      General: She is not in acute distress.    Appearance: Normal appearance. She is not ill-appearing, toxic-appearing or diaphoretic.  HENT:     Head: Normocephalic and atraumatic.  Eyes:     Conjunctiva/sclera: Conjunctivae normal.  Cardiovascular:     Rate and Rhythm: Normal rate.     Pulses: Normal pulses.  Pulmonary:     Effort: Pulmonary effort is normal.  Abdominal:     General: Abdomen is flat.     Tenderness: There is abdominal tenderness in the right lower quadrant. There is guarding and rebound. Negative signs include Murphy's sign.  Musculoskeletal:        General: Normal range of motion.     Cervical back: Normal range of motion.  Skin:    General: Skin is warm and dry.  Neurological:     General: No focal deficit present.     Mental Status: She is alert and oriented to person, place, and time.  Psychiatric:        Mood and Affect: Mood normal.      UC Treatments / Results  Labs (all labs ordered are listed, but only abnormal results are displayed) Labs Reviewed - No data to display  EKG   Radiology No results found.  Procedures Procedures (including critical care time)  Medications Ordered in UC Medications - No data to display  Initial Impression / Assessment and Plan / UC Course  I have reviewed the triage vital signs and the nursing notes.  Pertinent labs & imaging results that were available during my care of the patient were reviewed by me and considered in my medical decision making (see chart for details).     Assessment findings concerning for possible appendicitis.  Discussed with patient that she needs to go to the emergency room to be evaluated with a  CT scan.  Patient had initially  checked in to be evaluated here but then left for the Emergency Department.  Reports that she does not want to have to pay the emergency room co-pay to be evaluated there.  Discussed concern for possible appendicitis.  Patient asked if she can follow up with someone tomorrow.  It was discussed that patient can go home and see if pain improves but that if she develops any fever, nausea, vomiting, or worsening pain that she needs to go directly to the Emergency Department.  Discussed risks involved in waiting for evaluation.  Patient verbalized understanding.  Recommend ED follow up.   Final Clinical Impressions(s) / UC Diagnoses   Final diagnoses:  Right lower quadrant abdominal pain     Discharge Instructions     You need to go to the Emergency Department if you have any worsening pain, nausea, vomiting, diarrhea, or develop a fever.      ED Prescriptions    None     PDMP not reviewed this encounter.   Ivette Loyal, NP 12/06/20 2036

## 2020-12-07 ENCOUNTER — Encounter (HOSPITAL_BASED_OUTPATIENT_CLINIC_OR_DEPARTMENT_OTHER): Payer: Self-pay

## 2020-12-07 ENCOUNTER — Ambulatory Visit (HOSPITAL_BASED_OUTPATIENT_CLINIC_OR_DEPARTMENT_OTHER)
Admission: RE | Admit: 2020-12-07 | Discharge: 2020-12-07 | Disposition: A | Discharge status: Home / Self Care | Payer: 59 | Source: Ambulatory Visit | Attending: Family Medicine | Admitting: Family Medicine

## 2020-12-07 ENCOUNTER — Telehealth (HOSPITAL_BASED_OUTPATIENT_CLINIC_OR_DEPARTMENT_OTHER): Payer: Self-pay

## 2020-12-07 ENCOUNTER — Other Ambulatory Visit (HOSPITAL_BASED_OUTPATIENT_CLINIC_OR_DEPARTMENT_OTHER)
Admission: RE | Admit: 2020-12-07 | Discharge: 2020-12-07 | Disposition: A | Discharge status: Home / Self Care | Payer: 59 | Source: Ambulatory Visit | Attending: Family Medicine | Admitting: Family Medicine

## 2020-12-07 ENCOUNTER — Encounter (HOSPITAL_BASED_OUTPATIENT_CLINIC_OR_DEPARTMENT_OTHER): Payer: Self-pay | Admitting: Family Medicine

## 2020-12-07 ENCOUNTER — Ambulatory Visit (INDEPENDENT_AMBULATORY_CARE_PROVIDER_SITE_OTHER): Payer: 59 | Admitting: Family Medicine

## 2020-12-07 DIAGNOSIS — R109 Unspecified abdominal pain: Secondary | ICD-10-CM | POA: Insufficient documentation

## 2020-12-07 DIAGNOSIS — R1031 Right lower quadrant pain: Secondary | ICD-10-CM | POA: Insufficient documentation

## 2020-12-07 LAB — POCT URINALYSIS DIPSTICK
Appearance: NEGATIVE
Bilirubin, UA: NEGATIVE
Blood, UA: NEGATIVE
Glucose, UA: NEGATIVE
Ketones, UA: NEGATIVE
Leukocytes, UA: NEGATIVE
Nitrite, UA: NEGATIVE
Odor: NEGATIVE
Protein, UA: NEGATIVE
Spec Grav, UA: 1.02 (ref 1.010–1.025)
Urobilinogen, UA: 0.2 E.U./dL
pH, UA: 7 (ref 5.0–8.0)

## 2020-12-07 LAB — CBC WITH DIFFERENTIAL/PLATELET
Abs Immature Granulocytes: 0.03 10*3/uL (ref 0.00–0.07)
Basophils Absolute: 0 10*3/uL (ref 0.0–0.1)
Basophils Relative: 0 %
Eosinophils Absolute: 0.2 10*3/uL (ref 0.0–0.5)
Eosinophils Relative: 3 %
HCT: 43.3 % (ref 36.0–46.0)
Hemoglobin: 13.5 g/dL (ref 12.0–15.0)
Immature Granulocytes: 1 %
Lymphocytes Relative: 34 %
Lymphs Abs: 2.2 10*3/uL (ref 0.7–4.0)
MCH: 25 pg — ABNORMAL LOW (ref 26.0–34.0)
MCHC: 31.2 g/dL (ref 30.0–36.0)
MCV: 80.2 fL (ref 80.0–100.0)
Monocytes Absolute: 0.5 10*3/uL (ref 0.1–1.0)
Monocytes Relative: 8 %
Neutro Abs: 3.5 10*3/uL (ref 1.7–7.7)
Neutrophils Relative %: 54 %
Platelets: 238 10*3/uL (ref 150–400)
RBC: 5.4 MIL/uL — ABNORMAL HIGH (ref 3.87–5.11)
RDW: 14.6 % (ref 11.5–15.5)
WBC: 6.4 10*3/uL (ref 4.0–10.5)
nRBC: 0 % (ref 0.0–0.2)

## 2020-12-07 LAB — COMPREHENSIVE METABOLIC PANEL
ALT: 21 U/L (ref 0–44)
AST: 18 U/L (ref 15–41)
Albumin: 4.3 g/dL (ref 3.5–5.0)
Alkaline Phosphatase: 49 U/L (ref 38–126)
Anion gap: 7 (ref 5–15)
BUN: 8 mg/dL (ref 6–20)
CO2: 26 mmol/L (ref 22–32)
Calcium: 9.2 mg/dL (ref 8.9–10.3)
Chloride: 105 mmol/L (ref 98–111)
Creatinine, Ser: 0.59 mg/dL (ref 0.44–1.00)
GFR, Estimated: >60 mL/min (ref >60)
Glucose, Bld: 93 mg/dL (ref 70–99)
Potassium: 4.2 mmol/L (ref 3.5–5.1)
Sodium: 138 mmol/L (ref 135–145)
Total Bilirubin: 0.6 mg/dL (ref 0.3–1.2)
Total Protein: 6.9 g/dL (ref 6.5–8.1)

## 2020-12-07 NOTE — Telephone Encounter (Signed)
patient called the office this morning to discuss schedule for office visit at 01:10 pm

## 2020-12-07 NOTE — Patient Instructions (Addendum)
  Medication Instructions:  Your physician recommends that you continue on your current medications as directed. Please refer to the Current Medication list given to you today. --If you need a refill on any your medications before your next appointment, please call your pharmacy first. If no refills are authorized on file call the office.--  Lab Work: Your physician has recommended that you have lab work today: CBC and Comprehensive Metabolic Panel, and UA If you have labs (blood work) drawn today and your tests are completely normal, you will receive your results only by: Marland Kitchen MyChart Message (if you have MyChart) OR . A phone call from our staff. Please ensure you check your voicemail in the event that you authorized detailed messages to be left on a delegated number. If you have any lab test that is abnormal or we need to change your treatment, we will call you to review the results.  Referrals/Procedures/Imaging: Your physician recommends that you have an Ultrasound Study of Right Lower Abdominal Area.  X-rays use invisible electromagnetic energy beams to produce images of internal tissues, bones, and organs on film or digital media. Standard X-rays are performed for many reasons, including diagnosing tumors or bone injuries.    You may have these images done at the Imaging Center located at [facility] at [facility] on the ground floor, [phone]. They are a walk-in imaging facility. The hours of operation are:   Monday -- 7:30 AM - 5:00 PM Tuesday -- 7:30 AM - 5:00 PM Wednesday -- 7:30 AM - 5:00 PM Thursday -- 7:30 AM - 5:00 PM Friday -- 7:30 AM - 5:00 PM Saturday -- Closed Sunday -- Closed  Follow-Up: Your next appointment:   Your physician recommends that you schedule a follow-up appointment based on your test results  Thanks for letting us be apart of your health journey!!  Primary Care and Sports Medicine   Dr. de Peru and Shawna Clamp, DNP, AGNP  We  recommend signing up for the patient portal called "MyChart".  Sign up information is provided on this After Visit Summary.  MyChart is used to connect with patients for Virtual Visits (Telemedicine).  Patients are able to view lab/test results, encounter notes, upcoming appointments, etc.  Non-urgent messages can be sent to your provider as well.   To learn more about what you can do with MyChart, please visit --  [email or web address].

## 2020-12-07 NOTE — Assessment & Plan Note (Signed)
Uncertain etiology with thus uncertain prognosis Patient recurrent right lower quadrant pain and at presentation yesterday did have some rebound tenderness, however no labs were completed at time to evaluate for leukocytosis and she has not had any nausea, vomiting or recent fevers -thus her Alvarado score would have been borderline at 4 yesterday and with lack of rebound tenderness now would only be a score of 3 Given this, will initially evaluate with labs including CBC and CMP as well as limited ultrasound of the right lower quadrant If this work-up is unrevealing, will evaluate for alternative GI etiology while advised on precautions that should acute worsening occur, particularly with worsening abdominal pain or development of fevers, that she should present to emergency department for further evaluation

## 2020-12-07 NOTE — Telephone Encounter (Signed)
Patient called to inquire about having a CT scanned order  Patient states she went to the urgent care for abdominal pain and upon assessment their recommendation was that she get a CT at the ED Patient states she checked into the ED and was told it would be a 4 hour wait and a $200 copay so patient opted to leave Patient is scheduled to see Dr. de Peru 12/07/20 at 01:10 pm.  Patient is aware and agreeable to appointment.

## 2020-12-07 NOTE — Progress Notes (Addendum)
Procedures performed today:    None.  Independent interpretation of notes and tests performed by another provider:   None.  Brief History, Exam, Impression, and Recommendations:    Robin Burke is a 40 year old female presenting for evaluation of abdominal pain.  Reports that she has had intermittent abdominal pain for the past 3 months.  Yesterday she had sudden sharp increase in pain in her right lower quadrant which led to her presenting to a local urgent care.  At the time of evaluation she had some guarding and rebound tenderness per chart review and was advised on being evaluated at an emergency room with likely CT scan.  Patient went to the emergency department, however there was a 4-hour wait and due to this and not wanting to pay a co-pay she decided against evaluation in the emergency room and that time.  Today, she indicates that she still has some right lower quadrant discomfort, but it has improved since yesterday.  Indicates that yesterday's pain was about a 6 out of 10 and now it is a 2 out of 10.  Today or yesterday, she denies having experienced any fever, nausea, vomiting.  She does endorse some increased abdominal soreness with urination, but no dysuria.  She denies any changes in her bowel movements.  No associated symptoms with bowel movements.  Of note, she has also been evaluated by GYN in the past and she indicates that they had discussed possibility of obtaining ultrasound to evaluate for fibroids.  This has not been completed as of yet.  BP 130/76   Pulse 65   Ht 5\' 1"  (1.549 m)   Wt 180 lb 9.6 oz (81.9 kg)   LMP 11/24/2020   SpO2 98%   BMI 34.7 kg/m   40 year old female in no acute distress Abdomen exam: Soft, nondistended abdomen with moderate tenderness in right lower quadrant, no significant guarding, no rebound tenderness.  Normoactive bowel sounds present.  Abdominal pain Uncertain etiology with thus uncertain prognosis Patient recurrent right lower  quadrant pain and at presentation yesterday did have some rebound tenderness, however no labs were completed at time to evaluate for leukocytosis and she has not had any nausea, vomiting or recent fevers -thus her Alvarado score would have been borderline at 4 yesterday and with lack of rebound tenderness now would only be a score of 3 Given this, will initially evaluate with labs including CBC and CMP as well as limited ultrasound of the right lower quadrant If this work-up is unrevealing, will evaluate for alternative GI etiology while advised on precautions that should acute worsening occur, particularly with worsening abdominal pain or development of fevers, that she should present to emergency department for further evaluation  Next follow-up visit dependent upon results of above   ___________________________________________ Alize Acy de 20, MD, ABFM, CAQSM Primary Care and Sports Medicine [facility]  Received notification from imaging department at the med center here and was informed that they do not do ultrasounds of the right lower quadrant for evaluation of the appendix as a matter of protocol.  Discussed this with patient as well as lab results which showed normal white blood cell count, normal electrolytes, normal kidney function and liver function.  Also discussed normal UA results.  Discussed that all of these findings are reassuring and that given clinical impression, feel that acute appendicitis is very unlikely as well as possible alternative diagnoses such as kidney stone.  At this time, recommend further evaluation with GYN who had already  planned for ultrasound evaluation for fibroids.  We will also place referral to GI for further evaluation, consideration of possible procedural investigation.

## 2020-12-08 ENCOUNTER — Telehealth (HOSPITAL_BASED_OUTPATIENT_CLINIC_OR_DEPARTMENT_OTHER): Payer: Self-pay

## 2020-12-08 NOTE — Telephone Encounter (Signed)
-----   Message from Hosie Poisson Peru, MD sent at 12/07/2020  5:03 PM EDT ----- Spoke with patient regarding results, see addendum on office visit note.  Normal white blood cell count and normal CMP which are reassuring regarding differential diagnosis.  Urinalysis also reassuring.

## 2020-12-08 NOTE — Telephone Encounter (Signed)
Pt made aware of results with Dr. De Peru

## 2020-12-14 ENCOUNTER — Telehealth (HOSPITAL_BASED_OUTPATIENT_CLINIC_OR_DEPARTMENT_OTHER): Payer: Self-pay | Admitting: Obstetrics & Gynecology

## 2020-12-14 NOTE — Telephone Encounter (Signed)
Patient call and would like to have a ultrasound done .Patient also wanted me to tell that she having pain in vagina area.

## 2020-12-18 ENCOUNTER — Telehealth (HOSPITAL_BASED_OUTPATIENT_CLINIC_OR_DEPARTMENT_OTHER): Payer: Self-pay

## 2020-12-18 NOTE — Telephone Encounter (Signed)
Patient is calling again requesting we order an ultrasound on her. She is complaining of vaginal pain. She had an ultrasound done 12/07/2020 ordered by Dr. De Peru. She was last seen by you 06/08/2020. Please advise. [email or web address]

## 2020-12-19 NOTE — Telephone Encounter (Signed)
Called Robin Burke to advise that before an u/s could be ordered as she is requesting, she would need to make an appt with Dr. Hyacinth Meeker for evaluation, as it had been 7 months since being seen. Robin Burke verbalized understanding and an appt was made.

## 2020-12-19 NOTE — Telephone Encounter (Signed)
It's been 7 months and we didn't talk about any vaginal pain in October when I saw her last.  I think reevaluation is a good idea before ordering an ultrasound.

## 2020-12-19 NOTE — Telephone Encounter (Signed)
DOB verified. Called to advise pt that before an u/s was ordered she should be seen in the office by Dr. Hyacinth Meeker. Pt states that she and Dr. Hyacinth Meeker had a discussion about this pain when she saw her in December at her well woman visit. She states it mainly happens when she sneezes. She states that Dr. Hyacinth Meeker told her it could be a fibroid or hernia and that she should call back after her insurance is established so that an ultrasound could be set up to evaluate. Pt states that she has insurance now and would like to have the u/s set up. I inquired about the u/s that was ordered in April. She states that was ordered to rule out appendicitis which was not related to the pain she has now. She states it was not performed bc it was not the correct test to rule out appendicitis. Informed pt that I would send her request to the provider and let her know the recommendation. Pt verbalized understanding.

## 2020-12-26 ENCOUNTER — Other Ambulatory Visit: Payer: Self-pay

## 2020-12-26 ENCOUNTER — Encounter (HOSPITAL_BASED_OUTPATIENT_CLINIC_OR_DEPARTMENT_OTHER): Payer: Self-pay | Admitting: Obstetrics & Gynecology

## 2020-12-26 ENCOUNTER — Ambulatory Visit (INDEPENDENT_AMBULATORY_CARE_PROVIDER_SITE_OTHER): Payer: 59 | Admitting: Obstetrics & Gynecology

## 2020-12-26 VITALS — BP 130/81 | HR 68 | Ht 61.0 in | Wt 176.0 lb

## 2020-12-26 DIAGNOSIS — R3989 Other symptoms and signs involving the genitourinary system: Secondary | ICD-10-CM

## 2020-12-26 DIAGNOSIS — R1031 Right lower quadrant pain: Secondary | ICD-10-CM | POA: Diagnosis not present

## 2020-12-26 NOTE — Progress Notes (Signed)
GYNECOLOGY  VISIT  CC:   RLQ pain and pain with cough/sneeze  HPI: 40 y.o. G0P0000 Single White or Caucasian female here for complaint of intermittent RLQ pain for several months.  Does not have assisted nausea or emesis.  H/o constipation but has bowel movement about every other day now.  Has gained weight since last visit.  Has also been experiencing significant pain, right in midline near bladder, when coughs or sneezes.  Is severe enough that it can make her just pain and breath slowly until pain passes.  Does not have any issues with bladder function.  No dysuria.  Doesn't leak.  She did see Dr. De Peru who recommended abdominal ultrasound to assess appendix.  Doubtful this is appendix as has been going on too long.  Still persistent, intermittent pain is present and reasonable to proceed with imaging.  With contrast shortage, will need to reach out to radiology for recommendation.    GYNECOLOGIC HISTORY: Patient's last menstrual period was 12/18/2020. Contracdeption: condoms  Patient Active Problem List   Diagnosis Date Noted  . Abdominal pain 12/07/2020  . Insomnia 10/24/2020  . BMI 34.0-34.9,adult 10/24/2020  . ADHD 10/24/2020  . Vitamin D deficiency 10/28/2016  . Chronic fatigue 12/31/2013  . Thyroid nodule 12/31/2013    Past Medical History:  Diagnosis Date  . 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 STD (female) 2010   HSV I & II  by serology  . Thyroid nodule     Past Surgical History:  Procedure Laterality Date  . CHOLECYSTECTOMY N/A 01/22/2017   Procedure: LAPAROSCOPIC CHOLECYSTECTOMY;  Surgeon: Axel Filler, MD;  Location: [facility] OR;  Service: General;  Laterality: N/A;  . WISDOM TOOTH EXTRACTION  2007    MEDS:   Current Outpatient Medications on File Prior to Visit  Medication Sig Dispense Refill  . Cholecalciferol (VITAMIN D3) 250 MCG (10000 UT) capsule Take 1 capsule (10,000 Units total) by mouth once a week. 5 capsule 5  . lisdexamfetamine (VYVANSE) 10  MG capsule Take 1 capsule (10 mg total) by mouth daily. 30 capsule 0  . Multiple Vitamins-Minerals (ZINC PO) Take by mouth.    Marland Kitchen UNABLE TO FIND Vitamin c packets    . VYVANSE 20 MG capsule      No current facility-administered medications on file prior to visit.    ALLERGIES: Patient has no known allergies.  Family History  Problem Relation Age of Onset  . Diabetes Mother   . Hyperlipidemia Mother   . Coronary artery disease Father        Had 1 stent placed  . Thyroid cancer Maternal Grandmother   . Osteoarthritis Maternal Grandmother   . Diabetes Brother     SH:  Single, non smoker  Review of Systems  Constitutional: Negative.   Gastrointestinal: Positive for abdominal pain.  Genitourinary: Negative.     PHYSICAL EXAMINATION:    BP 130/81   Pulse 68   Ht 5\' 1"  (1.549 m)   Wt 176 lb (79.8 kg)   LMP 12/18/2020   BMI 33.25 kg/m     General appearance: alert, cooperative and appears stated age Abdomen: soft, mild tenderness to deep palpation in RLQ; bowel sounds normal; no masses,  no organomegaly, tenderness right in midline above pubic symphasis Lymph:  no inguinal LAD noted  Pelvic: pt declines pelvic exam  Assessment/Plan: 1. RLQ abdominal pain - with persistent nature, feel imaging is reasonable.  Will need to reach out to radiology for recommendations  about exam due to contrast shortage.  Concern with pain with cough for hernia and unsure of correct test to obtain.  Will be back in contact with pt regarding this.  2. Bladder pain/abdominal wall pain with sneezing/cough - possible hernia.  No clear abdominal wall defect but symptoms are consistent with hernia.  Again, will need to reach out to radiology for recommendations for exam.

## 2021-01-02 ENCOUNTER — Telehealth (HOSPITAL_BASED_OUTPATIENT_CLINIC_OR_DEPARTMENT_OTHER): Payer: Self-pay | Admitting: *Deleted

## 2021-01-02 ENCOUNTER — Encounter (HOSPITAL_BASED_OUTPATIENT_CLINIC_OR_DEPARTMENT_OTHER): Payer: Self-pay

## 2021-01-02 NOTE — Telephone Encounter (Signed)
[employer] called to verify if Prior Authorization was needed for CT of abd/pelvis without contrast. Representative states that it is not need with code R10.31. reference number [id number]

## 2021-01-04 ENCOUNTER — Other Ambulatory Visit (HOSPITAL_BASED_OUTPATIENT_CLINIC_OR_DEPARTMENT_OTHER): Payer: Self-pay | Admitting: Obstetrics & Gynecology

## 2021-01-04 DIAGNOSIS — R1031 Right lower quadrant pain: Secondary | ICD-10-CM

## 2021-01-15 ENCOUNTER — Other Ambulatory Visit: Payer: Self-pay

## 2021-01-15 ENCOUNTER — Ambulatory Visit
Admission: RE | Admit: 2021-01-15 | Discharge: 2021-01-15 | Disposition: A | Discharge status: Home / Self Care | Payer: 59 | Source: Ambulatory Visit | Attending: Obstetrics & Gynecology | Admitting: Obstetrics & Gynecology

## 2021-01-15 DIAGNOSIS — R1031 Right lower quadrant pain: Secondary | ICD-10-CM

## 2021-01-16 ENCOUNTER — Encounter (HOSPITAL_BASED_OUTPATIENT_CLINIC_OR_DEPARTMENT_OTHER): Payer: Self-pay

## 2021-01-16 NOTE — Telephone Encounter (Signed)
Please advise 

## 2021-01-24 ENCOUNTER — Ambulatory Visit (HOSPITAL_BASED_OUTPATIENT_CLINIC_OR_DEPARTMENT_OTHER): Payer: 59 | Admitting: Family Medicine

## 2021-01-29 ENCOUNTER — Encounter (HOSPITAL_BASED_OUTPATIENT_CLINIC_OR_DEPARTMENT_OTHER): Payer: Self-pay | Admitting: Family Medicine

## 2021-03-01 ENCOUNTER — Telehealth (HOSPITAL_BASED_OUTPATIENT_CLINIC_OR_DEPARTMENT_OTHER): Payer: Self-pay | Admitting: Family Medicine

## 2021-03-01 NOTE — Telephone Encounter (Signed)
Pt called disputing NO SHOW appt fee. Pt states she didnt need a 67month FU appt and didnt need [employer] advice from PCP for weight management

## 2021-04-11 ENCOUNTER — Telehealth (HOSPITAL_BASED_OUTPATIENT_CLINIC_OR_DEPARTMENT_OTHER): Payer: Self-pay | Admitting: Obstetrics & Gynecology

## 2021-04-11 NOTE — Telephone Encounter (Signed)
Called patient she stated she already had the ultrasound done and her surgery is already scheduled.

## 2021-05-11 HISTORY — PX: OTHER SURGICAL HISTORY: SHX169

## 2021-06-01 ENCOUNTER — Other Ambulatory Visit (HOSPITAL_BASED_OUTPATIENT_CLINIC_OR_DEPARTMENT_OTHER): Payer: Self-pay | Admitting: Family Medicine

## 2021-06-12 ENCOUNTER — Ambulatory Visit (HOSPITAL_BASED_OUTPATIENT_CLINIC_OR_DEPARTMENT_OTHER): Payer: Self-pay | Admitting: Obstetrics & Gynecology

## 2021-06-13 ENCOUNTER — Encounter (HOSPITAL_BASED_OUTPATIENT_CLINIC_OR_DEPARTMENT_OTHER): Payer: Self-pay | Admitting: Obstetrics & Gynecology

## 2021-06-13 ENCOUNTER — Ambulatory Visit (INDEPENDENT_AMBULATORY_CARE_PROVIDER_SITE_OTHER): Payer: 59 | Admitting: Obstetrics & Gynecology

## 2021-06-13 ENCOUNTER — Other Ambulatory Visit: Payer: Self-pay

## 2021-06-13 ENCOUNTER — Other Ambulatory Visit (HOSPITAL_COMMUNITY)
Admission: RE | Admit: 2021-06-13 | Discharge: 2021-06-13 | Disposition: A | Discharge status: Home / Self Care | Payer: 59 | Source: Ambulatory Visit | Attending: Obstetrics & Gynecology | Admitting: Obstetrics & Gynecology

## 2021-06-13 VITALS — BP 119/75 | HR 67 | Ht 61.0 in | Wt 178.8 lb

## 2021-06-13 DIAGNOSIS — Z9889 Other specified postprocedural states: Secondary | ICD-10-CM | POA: Diagnosis not present

## 2021-06-13 DIAGNOSIS — N898 Other specified noninflammatory disorders of vagina: Secondary | ICD-10-CM | POA: Insufficient documentation

## 2021-06-13 DIAGNOSIS — F3281 Premenstrual dysphoric disorder: Secondary | ICD-10-CM | POA: Diagnosis not present

## 2021-06-13 DIAGNOSIS — Z124 Encounter for screening for malignant neoplasm of cervix: Secondary | ICD-10-CM

## 2021-06-13 DIAGNOSIS — Z8742 Personal history of other diseases of the female genital tract: Secondary | ICD-10-CM

## 2021-06-13 DIAGNOSIS — E041 Nontoxic single thyroid nodule: Secondary | ICD-10-CM | POA: Diagnosis not present

## 2021-06-13 DIAGNOSIS — Z01419 Encounter for gynecological examination (general) (routine) without abnormal findings: Secondary | ICD-10-CM

## 2021-06-13 MED ORDER — FLUOXETINE HCL 10 MG PO TABS: ORAL_TABLET | ORAL | 1 refills | Status: DC

## 2021-06-13 MED ORDER — FLUCONAZOLE 150 MG PO TABS: 150.0000 mg | ORAL_TABLET | Freq: Once | ORAL | 0 refills | Status: AC

## 2021-06-13 NOTE — Progress Notes (Signed)
40 y.o. G0P0000 Single White or Caucasian female here for annual exam.  Has myomectomy on 05/11/2021 with Dr. April Manson.  First cycle after surgery was much improved.  Flow lasted for 4 days.  Flow was much less.    Planning on having an egg retrieval.  Together with significant for 3 years now.  He is younger than her.  She is not sure yet about timing.    Reports she's had significant PMS over the past many years.  Symptoms are really significant the week before her cycle.  Ready to consider treatment.  Does not want to be on contraception.  Has not had follow up about thyroid nodule and thyroid biopsy.  Endocrinologist she sees is not on Epic and she states she does not have appointment.      Sexually active: Yes.    The current method of family planning is none.    Exercising: No.  walking Smoker:  no  Health Maintenance: Pap:  06/08/2020 ASCUS History of abnormal Pap:  yes MMG:  guidelines reviewed Colonoscopy:  guidelines   reports that she quit smoking about 9 years ago. Her smoking use included cigarettes. She has a 2.50 pack-year smoking history. She has never used smokeless tobacco. She reports current alcohol use of about 2.0 standard drinks per week. She reports that she does not use drugs.  Past Medical History:  Diagnosis Date   HPV in female 2006   w/cryo, normal since   STD (female) 2010   HSV I & II  by serology   Thyroid nodule     Past Surgical History:  Procedure Laterality Date   CHOLECYSTECTOMY N/A 01/22/2017   Procedure: LAPAROSCOPIC CHOLECYSTECTOMY;  Surgeon: Axel Filler, MD;  Location: [facility] OR;  Service: General;  Laterality: N/A;   laprosopic myomectomy  05/11/2021   WISDOM TOOTH EXTRACTION  08/12/2005    Current Outpatient Medications  Medication Sig Dispense Refill   [employer] VITAMIN D3 250 MCG (10000 UT) CAPS TAKE 1 CAPSULE BY MOUTH ONCE A WEEK. 15 capsule 1   FLUoxetine (PROZAC) 10 MG tablet Take daily starting day 14 until cycle starts 45 tablet 1    Multiple Vitamins-Minerals (ZINC PO) Take by mouth.     UNABLE TO FIND Vitamin c packets     lisdexamfetamine (VYVANSE) 10 MG capsule Take 1 capsule (10 mg total) by mouth daily. (Patient not taking: Reported on 06/13/2021) 30 capsule 0   VYVANSE 20 MG capsule  (Patient not taking: Reported on 06/13/2021)     No current facility-administered medications for this visit.    Family History  Problem Relation Age of Onset   Diabetes Mother    Hyperlipidemia Mother    Coronary artery disease Father        Had 1 stent placed   Thyroid cancer Maternal Grandmother    Osteoarthritis Maternal Grandmother    Diabetes Brother     Review of Systems  Genitourinary:        Vaginal irritation   Exam:   BP 119/75 (BP Location: Left Arm, Patient Position: Sitting, Cuff Size: Large)   Pulse 67   Ht 5\' 1"  (1.549 m) Comment: reported  Wt 178 lb 12.8 oz (81.1 kg)   LMP 06/05/2021   BMI 33.78 kg/m   Height: 5\' 1"  (154.9 cm) (reported)  General appearance: alert, cooperative and appears stated age Head: Normocephalic, without obvious abnormality, atraumatic Neck: no adenopathy, supple,  Lungs: clear to auscultation bilaterally Breasts: normal appearance, no masses or tenderness Heart: regular  rate and rhythm Abdomen: soft, non-tender; bowel sounds normal; no masses,  no organomegaly Extremities: extremities normal, atraumatic, no cyanosis or edema Skin: Skin color, texture, turgor normal. No rashes or lesions Lymph nodes: Cervical, supraclavicular, and axillary nodes normal. No abnormal inguinal nodes palpated Neurologic: Grossly normal   Pelvic: External genitalia:  no lesions              Urethra:  normal appearing urethra with no masses, tenderness or lesions              Bartholins and Skenes: normal                 Vagina: normal appearing vagina with watery discharge with white clumps present              Cervix: no lesions              Pap taken: Yes.   Bimanual Exam:  Uterus:   mildly enlarged at 8 weeks, non tender, mobile              Adnexa: normal adnexa and no mass, fullness, tenderness  Chaperone, Ina Homes, CMA, was present for exam.  Assessment/Plan: 1. Well woman exam with routine gynecological exam - pap and HR HPV obtained today - MMG screening guidelines reviewed.  Will start after her birthday. - colon cancer screening guidelines reviewed - no lab work needed today. - care gaps reviewed/updated  2. History of abnormal cervical Pap smear - Cytology - PAP( [place])  3. Vaginal irritation - Cervicovaginal ancillary only( [place]) - fluconazole (DIFLUCAN) 150 MG tablet; Take 1 tablet (150 mg total) by mouth once for 1 dose. Repeat in 72 hours if symptoms are not completely resolved.  Dispense: 2 tablet; Refill: 0  4. Vaginal discharge  5. PMDD (premenstrual dysphoric disorder) - treatment options discussed.  Will start with luteal phase treatment with SSRI - fluconazole (DIFLUCAN) 150 MG tablet; Take 1 tablet (150 mg total) by mouth once for 1 dose. Repeat in 72 hours if symptoms are not completely resolved.  Dispense: 2 tablet; Refill: 0  6. H/O myomectomy  7. Thyroid nodule - pt is advised follow up with endocrinology is needed/recommended

## 2021-06-14 LAB — CERVICOVAGINAL ANCILLARY ONLY
Bacterial Vaginitis (gardnerella): NEGATIVE
Candida Glabrata: NEGATIVE
Candida Vaginitis: POSITIVE — AB
Comment: NEGATIVE
Comment: NEGATIVE
Comment: NEGATIVE

## 2021-06-22 LAB — CYTOLOGY - PAP
Comment: NEGATIVE
Diagnosis: NEGATIVE
High risk HPV: NEGATIVE

## 2021-06-27 ENCOUNTER — Other Ambulatory Visit: Payer: Self-pay | Admitting: Internal Medicine

## 2021-06-27 DIAGNOSIS — E041 Nontoxic single thyroid nodule: Secondary | ICD-10-CM

## 2021-07-11 ENCOUNTER — Ambulatory Visit
Admission: RE | Admit: 2021-07-11 | Discharge: 2021-07-11 | Disposition: A | Discharge status: Home / Self Care | Payer: 59 | Source: Ambulatory Visit | Attending: Internal Medicine | Admitting: Internal Medicine

## 2021-07-11 DIAGNOSIS — E041 Nontoxic single thyroid nodule: Secondary | ICD-10-CM

## 2021-07-30 ENCOUNTER — Ambulatory Visit (INDEPENDENT_AMBULATORY_CARE_PROVIDER_SITE_OTHER): Payer: 59 | Admitting: Family Medicine

## 2021-07-30 ENCOUNTER — Other Ambulatory Visit: Payer: Self-pay

## 2021-07-30 ENCOUNTER — Encounter (HOSPITAL_BASED_OUTPATIENT_CLINIC_OR_DEPARTMENT_OTHER): Payer: Self-pay | Admitting: Family Medicine

## 2021-07-30 DIAGNOSIS — M25551 Pain in right hip: Secondary | ICD-10-CM | POA: Diagnosis not present

## 2021-07-30 DIAGNOSIS — F909 Attention-deficit hyperactivity disorder, unspecified type: Secondary | ICD-10-CM | POA: Diagnosis not present

## 2021-07-30 DIAGNOSIS — E559 Vitamin D deficiency, unspecified: Secondary | ICD-10-CM | POA: Diagnosis not present

## 2021-07-30 DIAGNOSIS — M25552 Pain in left hip: Secondary | ICD-10-CM

## 2021-07-30 MED ORDER — VYVANSE 20 MG PO CAPS: 20.0000 mg | ORAL_CAPSULE | Freq: Every day | ORAL | 0 refills | Status: DC

## 2021-07-30 NOTE — Progress Notes (Signed)
° ° °  Procedures performed today:    None.  Independent interpretation of notes and tests performed by another provider:   None.  Brief History, Exam, Impression, and Recommendations:    ADHD Reports good control of symptoms with Vyvanse at present dose of 20 mg daily, requesting refill today.  PDMP reviewed without any red flags observed.  Refill sent to pharmacy  Bilateral hip pain Reports that she began having bilateral hip pain about 3 to 4 weeks ago.  No inciting event recalled.  Pain has been relatively persistent, can fluctuate in intensity.  Reports that pain today is 3 out of 10, at its worst it is about a 7 or 8 out of 10.  Pain is worse with certain activities, sometimes aggravated by yoga, worse when sitting for prolonged periods of time.  Has found little bit tough to get into cars.  Pain primarily located over anterolateral hips On exam, normal active and passive range of motion, normal strength for flexion, abduction and abduction.  Some pain with resisted abduction, flexion.  Negative FABER and FADIR, negative logroll. Suspect source of pain is hip flexor, abductors, do not feel that source of pain is intra-articular Discussed recommended treatment, recommend referral to physical therapy, patient interested, referral placed today Can continue with OTC measures to help with pain control Advised on home exercise program as per PT Plan for follow-up in about 6 to 8 weeks to monitor progress, if pain is persisting, consider imaging  Vitamin D deficiency History of vitamin D deficiency, has not been checked recently, she is requesting check today, order placed   ___________________________________________ Arrian Manson de [place], MD, ABFM, CAQSM Primary Care and Sports Medicine [facility]

## 2021-07-30 NOTE — Patient Instructions (Signed)
°  Medication Instructions:  Your physician recommends that you continue on your current medications as directed. Please refer to the Current Medication list given to you today. --If you need a refill on any your medications before your next appointment, please call your pharmacy first. If no refills are authorized on file call the office.--  Referrals/Procedures/Imaging: A referral has been placed for you to go to Outpatient Physical Therapy at [facility]. Someone from the scheduling department will be in contact with you in regards to coordinating your consultation. If you do not hear from any of the schedulers within 7-10 business days please give our office a call.  [facility] at [facility] on the 1st Floor Hours of Operation: Monday - Friday 8:00 am - 5:00 pm Closed on weekends and all major holidays (P) [phone]   Follow-Up: Your next appointment:   Your physician recommends that you schedule a follow-up appointment in: 2 MONTHS with Dr. de [place]  You will receive a text message or e-mail with a link to a survey about your care and experience with [place] today! We would greatly appreciate your feedback!   Thanks for letting us be apart of your health journey!!  Primary Care and Sports Medicine   Dr. Ceasar Mons [place]   We encourage you to activate your patient portal called "MyChart".  Sign up information is provided on this After Visit Summary.  MyChart is used to connect with patients for Virtual Visits (Telemedicine).  Patients are able to view lab/test results, encounter notes, upcoming appointments, etc.  Non-urgent messages can be sent to your provider as well. To learn more about what you can do with MyChart, please visit --  [email or web address].

## 2021-10-01 ENCOUNTER — Encounter (HOSPITAL_BASED_OUTPATIENT_CLINIC_OR_DEPARTMENT_OTHER): Payer: Self-pay | Admitting: Family Medicine

## 2021-10-01 ENCOUNTER — Other Ambulatory Visit: Payer: Self-pay

## 2021-10-01 ENCOUNTER — Ambulatory Visit (INDEPENDENT_AMBULATORY_CARE_PROVIDER_SITE_OTHER): Payer: 59 | Admitting: Family Medicine

## 2021-10-01 VITALS — BP 117/72 | HR 73 | Ht 61.0 in | Wt 177.9 lb

## 2021-10-01 DIAGNOSIS — M25552 Pain in left hip: Secondary | ICD-10-CM

## 2021-10-01 DIAGNOSIS — M25551 Pain in right hip: Secondary | ICD-10-CM | POA: Diagnosis not present

## 2021-10-01 DIAGNOSIS — F909 Attention-deficit hyperactivity disorder, unspecified type: Secondary | ICD-10-CM | POA: Diagnosis not present

## 2021-10-01 MED ORDER — VYVANSE 20 MG PO CAPS: 20.0000 mg | ORAL_CAPSULE | Freq: Every day | ORAL | 0 refills | Status: DC

## 2021-10-01 NOTE — Assessment & Plan Note (Signed)
Reports good control of symptoms with Vyvanse at present dose of 20 mg daily, requesting refill today.  PDMP reviewed without any red flags observed.  Refill sent to pharmacy

## 2021-10-01 NOTE — Assessment & Plan Note (Signed)
Reports that she began having bilateral hip pain about 3 to 4 weeks ago.  No inciting event recalled.  Pain has been relatively persistent, can fluctuate in intensity.  Reports that pain today is 3 out of 10, at its worst it is about a 7 or 8 out of 10.  Pain is worse with certain activities, sometimes aggravated by yoga, worse when sitting for prolonged periods of time.  Has found little bit tough to get into cars.  Pain primarily located over anterolateral hips On exam, normal active and passive range of motion, normal strength for flexion, abduction and abduction.  Some pain with resisted abduction, flexion.  Negative FABER and FADIR, negative logroll. Suspect source of pain is hip flexor, abductors, do not feel that source of pain is intra-articular Discussed recommended treatment, recommend referral to physical therapy, patient interested, referral placed today Can continue with OTC measures to help with pain control Advised on home exercise program as per PT Plan for follow-up in about 6 to 8 weeks to monitor progress, if pain is persisting, consider imaging

## 2021-10-01 NOTE — Patient Instructions (Signed)
°  Medication Instructions:  °Your physician recommends that you continue on your current medications as directed. Please refer to the Current Medication list given to you today. °--If you need a refill on any your medications before your next appointment, please call your pharmacy first. If no refills are authorized on file call the office.-- ° ° ° °Follow-Up: °Your next appointment:   °Your physician recommends that you schedule a follow-up appointment in: 2-3 months with Dr. de [place] ° °You will receive a text message or e-mail with a link to a survey about your care and experience with us today! We would greatly appreciate your feedback!  ° °Thanks for letting us be apart of your health journey!!  °Primary Care and Sports Medicine  ° °Dr. Raymond de [place]  ° °We encourage you to activate your patient portal called "MyChart".  Sign up information is provided on this After Visit Summary.  MyChart is used to connect with patients for Virtual Visits (Telemedicine).  Patients are able to view lab/test results, encounter notes, upcoming appointments, etc.  Non-urgent messages can be sent to your provider as well. To learn more about what you can do with MyChart, please visit --  [email or web address]    °

## 2021-10-01 NOTE — Progress Notes (Signed)
° ° °  Procedures performed today:    None.  Independent interpretation of notes and tests performed by another provider:   None.  Brief History, Exam, Impression, and Recommendations:    BP 117/72    Pulse 73    Ht 5\' 1"  (1.549 m)    Wt 177 lb 14.4 oz (80.7 kg)    SpO2 96%    BMI 33.61 kg/m   Bilateral hip pain Continues to have hip pain, primarily on left side at present, but has noticed improvement in symptoms since working with PT. Has been working with integrative therapies in regards to physical therapy.  Reports that she is planning to complete more sessions, waiting on further approval from insurance.  Reports completing 8 sessions thus far. Symptoms will still be aggravated by prolonged sitting, being on her feet for prolonged period of time.  Her daytime job involves a lot of sitting while her less frequent evening job involves a lot of standing. She wonders if a letter may help her in getting an adjustable desk to allow her to sit and stand at times.  She will talk with work and then find out if a letter from [place] would be helpful.  ADHD Requesting refill of Vyvanse today.  Reports that she has been doing well with current 20 mg dose and would like to remain at this dose.  Denies any issues chest pain, palpitations, sleep issues. Reviewed PDMP, no red flags today Refill of Vyvanse sent to pharmacy on file  Plan for follow-up in about 2-3 months or sooner as needed   ___________________________________________ Tyreanna Bisesi de [place], MD, ABFM, CAQSM Primary Care and [place]

## 2021-10-01 NOTE — Assessment & Plan Note (Signed)
History of vitamin D deficiency, has not been checked recently, she is requesting check today, order placed

## 2021-10-01 NOTE — Assessment & Plan Note (Signed)
Continues to have hip pain, primarily on left side at present, but has noticed improvement in symptoms since working with PT. Has been working with integrative therapies in regards to physical therapy.  Reports that she is planning to complete more sessions, waiting on further approval from insurance.  Reports completing 8 sessions thus far. Symptoms will still be aggravated by prolonged sitting, being on her feet for prolonged period of time.  Her daytime job involves a lot of sitting while her less frequent evening job involves a lot of standing. She wonders if a letter may help her in getting an adjustable desk to allow her to sit and stand at times.  She will talk with work and then find out if a letter from [place] would be helpful.

## 2021-10-01 NOTE — Assessment & Plan Note (Signed)
Requesting refill of Vyvanse today.  Reports that she has been doing well with current 20 mg dose and would like to remain at this dose.  Denies any issues chest pain, palpitations, sleep issues. Reviewed PDMP, no red flags today Refill of Vyvanse sent to pharmacy on file

## 2021-11-29 ENCOUNTER — Ambulatory Visit (HOSPITAL_BASED_OUTPATIENT_CLINIC_OR_DEPARTMENT_OTHER): Payer: 59 | Admitting: Family Medicine

## 2022-01-08 ENCOUNTER — Ambulatory Visit (INDEPENDENT_AMBULATORY_CARE_PROVIDER_SITE_OTHER): Payer: 59 | Admitting: Family Medicine

## 2022-01-08 ENCOUNTER — Encounter (HOSPITAL_BASED_OUTPATIENT_CLINIC_OR_DEPARTMENT_OTHER): Payer: Self-pay | Admitting: Family Medicine

## 2022-01-08 ENCOUNTER — Ambulatory Visit (INDEPENDENT_AMBULATORY_CARE_PROVIDER_SITE_OTHER): Payer: 59

## 2022-01-08 VITALS — BP 121/84 | HR 66 | Ht 61.0 in | Wt 182.2 lb

## 2022-01-08 DIAGNOSIS — M25552 Pain in left hip: Secondary | ICD-10-CM | POA: Diagnosis not present

## 2022-01-08 DIAGNOSIS — F909 Attention-deficit hyperactivity disorder, unspecified type: Secondary | ICD-10-CM

## 2022-01-08 DIAGNOSIS — M25551 Pain in right hip: Secondary | ICD-10-CM

## 2022-01-08 MED ORDER — LISDEXAMFETAMINE DIMESYLATE 10 MG PO CAPS: 10.0000 mg | ORAL_CAPSULE | Freq: Every day | ORAL | 0 refills | Status: DC

## 2022-01-08 NOTE — Progress Notes (Signed)
    Procedures performed today:    None.  Independent interpretation of notes and tests performed by another provider:   None.  Brief History, Exam, Impression, and Recommendations:    BP 121/84   Pulse 66   Ht 5\' 1"  (1.549 m)   Wt 182 lb 3.2 oz (82.6 kg)   SpO2 100%   BMI 34.43 kg/m   Bilateral hip pain Patient presents for follow-up of bilateral hip pain, left worse than right.  She indicates that pain has been worsened somewhat recently as a result of an injury.  About 10 days ago, she was tubing and came down hard on her hip/back and heard a crack.  Pain has been increased since that time.  She was working with physical therapy previously, completed at least 8 sessions worth, had some slight improvement, however feels that she expected to get more relief with PT.  Aside from increased pain, no new symptoms in regards to location or radiation of pain. Discussed options with patient this time, has not had any dedicated imaging as of yet, will proceed with x-rays of bilateral hips and pelvis Discussed that likely would recommend proceeding with additional conservative treatment, particularly if x-rays are unremarkable.  In that case, could consider working with alternative physical therapist as a second opinion.  She does have questions about working with a , did discuss that while evidence supporting use of chiropractic interventions is limited, could be considered, particularly if imaging is reassuring Also discussed possibility of evaluation with orthopedic surgeon for further recommendations, she will consider  ADHD Continues with Vyvanse 20 mg, requesting refill of medication today, however feels that the 20 mg may be too strong of a dose and is interested in decreasing dose to 10 mg daily.  She will use the medication intermittently as she will sometimes forget to take the medication and generally does not like to take medication. Reviewed PDMP, no red flags today,  provided refill of Vyvanse, but with new dose at 10 mg dose  Return in about 3 months (around 04/10/2022).  For med management   ___________________________________________ Lemar Bakos de 04/12/2022, MD, ABFM, [facility] Primary Care and Sports Medicine [facility]

## 2022-01-08 NOTE — Assessment & Plan Note (Signed)
Patient presents for follow-up of bilateral hip pain, left worse than right.  She indicates that pain has been worsened somewhat recently as a result of an injury.  About 10 days ago, she was tubing and came down hard on her hip/back and heard a crack.  Pain has been increased since that time.  She was working with physical therapy previously, completed at least 8 sessions worth, had some slight improvement, however feels that she expected to get more relief with PT.  Aside from increased pain, no new symptoms in regards to location or radiation of pain. Discussed options with patient this time, has not had any dedicated imaging as of yet, will proceed with x-rays of bilateral hips and pelvis Discussed that likely would recommend proceeding with additional conservative treatment, particularly if x-rays are unremarkable.  In that case, could consider working with alternative physical therapist as a second opinion.  She does have questions about working with a [place], did discuss that while evidence supporting use of chiropractic interventions is limited, could be considered, particularly if imaging is reassuring Also discussed possibility of evaluation with orthopedic surgeon for further recommendations, she will consider

## 2022-01-08 NOTE — Assessment & Plan Note (Signed)
Continues with Vyvanse 20 mg, requesting refill of medication today, however feels that the 20 mg may be too strong of a dose and is interested in decreasing dose to 10 mg daily.  She will use the medication intermittently as she will sometimes forget to take the medication and generally does not like to take medication. Reviewed PDMP, no red flags today, provided refill of Vyvanse, but with new dose at 10 mg dose

## 2022-01-22 ENCOUNTER — Telehealth (HOSPITAL_BASED_OUTPATIENT_CLINIC_OR_DEPARTMENT_OTHER): Payer: Self-pay

## 2022-01-22 NOTE — Telephone Encounter (Signed)
Pt was called and she is aware of her x-rays results. Pt was not satisfied with the x-ray she stated she wanted an mri done but the provider said it wasn't necessary at all. We offered her an appt to talk with the provider to go more in details with things. She declined because I told her a telephone visit will be billed and she was not happy. She stated she will find another provider.

## 2022-03-26 ENCOUNTER — Other Ambulatory Visit (HOSPITAL_COMMUNITY)
Admission: RE | Admit: 2022-03-26 | Discharge: 2022-03-26 | Disposition: A | Discharge status: Home / Self Care | Payer: 59 | Source: Ambulatory Visit | Attending: Obstetrics & Gynecology | Admitting: Obstetrics & Gynecology

## 2022-03-26 ENCOUNTER — Ambulatory Visit (HOSPITAL_BASED_OUTPATIENT_CLINIC_OR_DEPARTMENT_OTHER): Payer: 59

## 2022-03-26 DIAGNOSIS — N898 Other specified noninflammatory disorders of vagina: Secondary | ICD-10-CM | POA: Insufficient documentation

## 2022-03-26 NOTE — Progress Notes (Signed)
Patient came in today with complaints of vaginal discharge. Aptima swab obtained. tbw

## 2022-03-27 LAB — CERVICOVAGINAL ANCILLARY ONLY
Bacterial Vaginitis (gardnerella): POSITIVE — AB
Candida Glabrata: NEGATIVE
Candida Vaginitis: NEGATIVE
Comment: NEGATIVE
Comment: NEGATIVE
Comment: NEGATIVE

## 2022-04-01 ENCOUNTER — Other Ambulatory Visit (HOSPITAL_BASED_OUTPATIENT_CLINIC_OR_DEPARTMENT_OTHER): Payer: Self-pay | Admitting: *Deleted

## 2022-04-01 MED ORDER — METRONIDAZOLE 0.75 % VA GEL: 1.0000 | Freq: Every day | VAGINAL | 0 refills | Status: DC

## 2022-04-01 NOTE — Progress Notes (Signed)
Rx sent to pharmacy for treatment of BV 

## 2022-04-10 ENCOUNTER — Ambulatory Visit (HOSPITAL_BASED_OUTPATIENT_CLINIC_OR_DEPARTMENT_OTHER): Payer: 59 | Admitting: Family Medicine

## 2022-05-01 ENCOUNTER — Other Ambulatory Visit: Payer: Self-pay | Admitting: Endocrinology

## 2022-05-01 DIAGNOSIS — E063 Autoimmune thyroiditis: Secondary | ICD-10-CM

## 2022-05-01 DIAGNOSIS — E041 Nontoxic single thyroid nodule: Secondary | ICD-10-CM

## 2022-05-06 ENCOUNTER — Other Ambulatory Visit: Payer: Self-pay | Admitting: Endocrinology

## 2022-05-06 DIAGNOSIS — E063 Autoimmune thyroiditis: Secondary | ICD-10-CM

## 2022-05-06 DIAGNOSIS — E041 Nontoxic single thyroid nodule: Secondary | ICD-10-CM

## 2022-05-27 ENCOUNTER — Ambulatory Visit (HOSPITAL_BASED_OUTPATIENT_CLINIC_OR_DEPARTMENT_OTHER): Payer: 59 | Admitting: Family Medicine

## 2022-06-20 ENCOUNTER — Ambulatory Visit (INDEPENDENT_AMBULATORY_CARE_PROVIDER_SITE_OTHER): Payer: 59 | Admitting: Obstetrics & Gynecology

## 2022-06-20 ENCOUNTER — Encounter (HOSPITAL_BASED_OUTPATIENT_CLINIC_OR_DEPARTMENT_OTHER): Payer: Self-pay | Admitting: Obstetrics & Gynecology

## 2022-06-20 ENCOUNTER — Other Ambulatory Visit (HOSPITAL_COMMUNITY)
Admission: RE | Admit: 2022-06-20 | Discharge: 2022-06-20 | Disposition: A | Discharge status: Home / Self Care | Payer: 59 | Source: Ambulatory Visit | Attending: Obstetrics & Gynecology | Admitting: Obstetrics & Gynecology

## 2022-06-20 VITALS — BP 119/57 | HR 72 | Ht 61.0 in | Wt 180.4 lb

## 2022-06-20 DIAGNOSIS — Z01419 Encounter for gynecological examination (general) (routine) without abnormal findings: Secondary | ICD-10-CM | POA: Diagnosis not present

## 2022-06-20 DIAGNOSIS — Z0000 Encounter for general adult medical examination without abnormal findings: Secondary | ICD-10-CM

## 2022-06-20 DIAGNOSIS — Z8742 Personal history of other diseases of the female genital tract: Secondary | ICD-10-CM

## 2022-06-20 DIAGNOSIS — Z8619 Personal history of other infectious and parasitic diseases: Secondary | ICD-10-CM

## 2022-06-20 DIAGNOSIS — N852 Hypertrophy of uterus: Secondary | ICD-10-CM

## 2022-06-20 DIAGNOSIS — Z113 Encounter for screening for infections with a predominantly sexual mode of transmission: Secondary | ICD-10-CM

## 2022-06-20 DIAGNOSIS — Z9889 Other specified postprocedural states: Secondary | ICD-10-CM

## 2022-06-20 DIAGNOSIS — E041 Nontoxic single thyroid nodule: Secondary | ICD-10-CM

## 2022-06-20 DIAGNOSIS — Z1231 Encounter for screening mammogram for malignant neoplasm of breast: Secondary | ICD-10-CM

## 2022-06-20 NOTE — Progress Notes (Signed)
41 y.o. G0P0000 Single White or Caucasian female here for annual exam.  Currently single.  She and long term partner broke up in July.  Will do STD testing today.    Had myomectomy 04/2021 with Dr. Kerin Perna.  Flow is better.  Still having cramping.  PMS symptoms are still there.  She and I discussed cyclical dosing of prozac.  She doesn't really want to take this.  Would like other suggestions.  [place] discussed.    Lots of stress at work.             Sexually active: No.  The current method of family planning is none.    Smoker:  no  Health Maintenance: Pap:  06/13/2021 Negative History of abnormal Pap:  yes MMG:  guidelines reviewed Colonoscopy:  guidelines reviewed Screening Labs: 11/2020   reports that she quit smoking about 10 years ago. Her smoking use included cigarettes. She has a 2.50 pack-year smoking history. She has never used smokeless tobacco. She reports current alcohol use of about 2.0 standard drinks of alcohol per week. She reports that she does not use drugs.  Past Medical History:  Diagnosis Date   HPV in female 2006   w/cryo, normal since   STD (female) 2010   HSV I & II  by serology   Thyroid nodule     Past Surgical History:  Procedure Laterality Date   CHOLECYSTECTOMY N/A 01/22/2017   Procedure: LAPAROSCOPIC CHOLECYSTECTOMY;  Surgeon: Ralene Ok, MD;  Location: [facility];  Service: General;  Laterality: N/A;   laprosopic myomectomy  05/11/2021   WISDOM TOOTH EXTRACTION  08/12/2005    Current Outpatient Medications  Medication Sig Dispense Refill   [employer]S VITAMIN D3 250 MCG (10000 UT) CAPS TAKE 1 CAPSULE BY MOUTH ONCE A WEEK. 15 capsule 1   lisdexamfetamine (VYVANSE) 10 MG capsule Take 1 capsule (10 mg total) by mouth daily. 30 capsule 0   UNABLE TO FIND Vitamin c packets     Multiple Vitamins-Minerals (ZINC PO) Take by mouth. (Patient not taking: Reported on 01/08/2022)     No current facility-administered medications for this visit.     Family History  Problem Relation Age of Onset   Diabetes Mother    Hyperlipidemia Mother    Coronary artery disease Father        Had 1 stent placed   Thyroid cancer Maternal Grandmother    Osteoarthritis Maternal Grandmother    Diabetes Brother     ROS: Constitutional: negative Genitourinary:negative  Exam:   BP (!) 119/57 (BP Location: Right Arm, Patient Position: Sitting, Cuff Size: Large)   Pulse 72   Ht 5\' 1"  (1.549 m) Comment: Reported  Wt 180 lb 6.4 oz (81.8 kg)   BMI 34.09 kg/m   Height: 5\' 1"  (154.9 cm) (Reported)  General appearance: alert, cooperative and appears stated age Head: Normocephalic, without obvious abnormality, atraumatic Neck: no adenopathy, supple, symmetrical, trachea midline and thyroid normal to inspection and palpation Lungs: clear to auscultation bilaterally Breasts: normal appearance, no masses or tenderness Heart: regular rate and rhythm Abdomen: soft, non-tender; bowel sounds normal; no masses,  no organomegaly Extremities: extremities normal, atraumatic, no cyanosis or edema Skin: Skin color, texture, turgor normal. No rashes or lesions Lymph nodes: Cervical, supraclavicular, and axillary nodes normal. No abnormal inguinal nodes palpated Neurologic: Grossly normal   Pelvic: External genitalia:  no lesions              Urethra:  normal appearing urethra with no  masses, tenderness or lesions              Bartholins and Skenes: normal                 Vagina: normal appearing vagina with normal color and no discharge, no lesions              Cervix: no lesions              Pap taken: Yes.   Bimanual Exam:  Uterus:  10 weeks and globular              Adnexa: normal adnexa and no mass, fullness, tenderness               Rectovaginal: Confirms               Anus:  normal sphincter tone, no lesions  Chaperone, Ina Homes, CMA, was present for exam.  Assessment/Plan: 1. Well woman exam with routine gynecological exam - Pap smear  obtained today - Mammogram guidelines reviewed - Colonoscopy not indicated.  Guidelines reviewed. - thyroid testing done with Dr. Talmage Nap.  Fasting lab work ordered. - vaccines reviewed/updated  2. Screening examination for STD (sexually transmitted disease) - Cytology - PAP( [place])  3. History of abnormal cervical Pap smear - Cytology - PAP( [place])  4. History of HPV infection  5. Encounter for screening mammogram for malignant neoplasm of breast - MMG ordered  6. Thyroid nodule - Has ultrasound scheduled later this month  7. History of myomectomy - done 04/2021 with Dr. April Manson - will plan ultrasound due to size of uterus on exam

## 2022-06-24 ENCOUNTER — Other Ambulatory Visit (HOSPITAL_BASED_OUTPATIENT_CLINIC_OR_DEPARTMENT_OTHER): Payer: 59

## 2022-06-24 DIAGNOSIS — Z0000 Encounter for general adult medical examination without abnormal findings: Secondary | ICD-10-CM

## 2022-06-25 LAB — CYTOLOGY - PAP
Adequacy: ABSENT
Chlamydia: NEGATIVE
Comment: NEGATIVE
Comment: NEGATIVE
Comment: NORMAL
Diagnosis: NEGATIVE
High risk HPV: NEGATIVE
Neisseria Gonorrhea: NEGATIVE

## 2022-06-25 LAB — COMPREHENSIVE METABOLIC PANEL
ALT: 17 IU/L (ref 0–32)
AST: 19 IU/L (ref 0–40)
Albumin/Globulin Ratio: 1.9 (ref 1.2–2.2)
Albumin: 4.1 g/dL (ref 3.9–4.9)
Alkaline Phosphatase: 60 IU/L (ref 44–121)
BUN/Creatinine Ratio: 13 (ref 9–23)
BUN: 12 mg/dL (ref 6–24)
Bilirubin Total: 0.5 mg/dL (ref 0.0–1.2)
CO2: 21 mmol/L (ref 20–29)
Calcium: 8.7 mg/dL (ref 8.7–10.2)
Chloride: 107 mmol/L — ABNORMAL HIGH (ref 96–106)
Creatinine, Ser: 0.9 mg/dL (ref 0.57–1.00)
Globulin, Total: 2.2 g/dL (ref 1.5–4.5)
Glucose: 111 mg/dL — ABNORMAL HIGH (ref 70–99)
Potassium: 4.4 mmol/L (ref 3.5–5.2)
Sodium: 142 mmol/L (ref 134–144)
Total Protein: 6.3 g/dL (ref 6.0–8.5)
eGFR: 83 mL/min/{1.73_m2} (ref >59)

## 2022-06-25 LAB — HEMOGLOBIN A1C
Est. average glucose Bld gHb Est-mCnc: 108 mg/dL
Hgb A1c MFr Bld: 5.4 % (ref 4.8–5.6)

## 2022-06-25 LAB — LIPID PANEL
Chol/HDL Ratio: 3.5 ratio (ref 0.0–4.4)
Cholesterol, Total: 194 mg/dL (ref 100–199)
HDL: 56 mg/dL (ref >39)
LDL Chol Calc (NIH): 127 mg/dL — ABNORMAL HIGH (ref 0–99)
Triglycerides: 62 mg/dL (ref 0–149)
VLDL Cholesterol Cal: 11 mg/dL (ref 5–40)

## 2022-06-27 ENCOUNTER — Other Ambulatory Visit: Payer: 59

## 2022-06-28 ENCOUNTER — Ambulatory Visit
Admission: RE | Admit: 2022-06-28 | Discharge: 2022-06-28 | Disposition: A | Discharge status: Home / Self Care | Payer: 59 | Source: Ambulatory Visit | Attending: Endocrinology | Admitting: Endocrinology

## 2022-06-28 DIAGNOSIS — E041 Nontoxic single thyroid nodule: Secondary | ICD-10-CM

## 2022-06-28 DIAGNOSIS — E063 Autoimmune thyroiditis: Secondary | ICD-10-CM

## 2022-07-08 ENCOUNTER — Other Ambulatory Visit (HOSPITAL_BASED_OUTPATIENT_CLINIC_OR_DEPARTMENT_OTHER): Payer: Self-pay | Admitting: Obstetrics & Gynecology

## 2022-07-08 ENCOUNTER — Ambulatory Visit (HOSPITAL_BASED_OUTPATIENT_CLINIC_OR_DEPARTMENT_OTHER): Payer: 59

## 2022-07-08 ENCOUNTER — Other Ambulatory Visit (HOSPITAL_BASED_OUTPATIENT_CLINIC_OR_DEPARTMENT_OTHER): Payer: Self-pay | Admitting: Family Medicine

## 2022-07-08 ENCOUNTER — Inpatient Hospital Stay (HOSPITAL_BASED_OUTPATIENT_CLINIC_OR_DEPARTMENT_OTHER): Admission: RE | Admit: 2022-07-08 | Payer: 59 | Source: Ambulatory Visit | Admitting: Radiology

## 2022-07-17 ENCOUNTER — Encounter (HOSPITAL_BASED_OUTPATIENT_CLINIC_OR_DEPARTMENT_OTHER): Payer: Self-pay | Admitting: Obstetrics & Gynecology

## 2022-07-18 ENCOUNTER — Other Ambulatory Visit (HOSPITAL_BASED_OUTPATIENT_CLINIC_OR_DEPARTMENT_OTHER): Payer: Self-pay | Admitting: Obstetrics & Gynecology

## 2022-07-23 ENCOUNTER — Ambulatory Visit (HOSPITAL_BASED_OUTPATIENT_CLINIC_OR_DEPARTMENT_OTHER)
Admission: RE | Admit: 2022-07-23 | Discharge: 2022-07-23 | Disposition: A | Discharge status: Home / Self Care | Payer: 59 | Source: Ambulatory Visit | Attending: Obstetrics & Gynecology | Admitting: Obstetrics & Gynecology

## 2022-07-23 ENCOUNTER — Encounter (HOSPITAL_BASED_OUTPATIENT_CLINIC_OR_DEPARTMENT_OTHER): Payer: Self-pay

## 2022-07-23 DIAGNOSIS — N852 Hypertrophy of uterus: Secondary | ICD-10-CM

## 2022-07-23 DIAGNOSIS — Z9889 Other specified postprocedural states: Secondary | ICD-10-CM

## 2022-08-01 ENCOUNTER — Other Ambulatory Visit (HOSPITAL_BASED_OUTPATIENT_CLINIC_OR_DEPARTMENT_OTHER): Payer: Self-pay | Admitting: Obstetrics & Gynecology

## 2022-08-02 ENCOUNTER — Other Ambulatory Visit (HOSPITAL_BASED_OUTPATIENT_CLINIC_OR_DEPARTMENT_OTHER): Payer: Self-pay | Admitting: Obstetrics & Gynecology

## 2022-08-08 ENCOUNTER — Other Ambulatory Visit (HOSPITAL_COMMUNITY)
Admission: RE | Admit: 2022-08-08 | Discharge: 2022-08-08 | Disposition: A | Discharge status: Home / Self Care | Payer: 59 | Source: Ambulatory Visit | Attending: Obstetrics & Gynecology | Admitting: Obstetrics & Gynecology

## 2022-08-08 ENCOUNTER — Ambulatory Visit (INDEPENDENT_AMBULATORY_CARE_PROVIDER_SITE_OTHER): Payer: 59 | Admitting: *Deleted

## 2022-08-08 DIAGNOSIS — R8290 Unspecified abnormal findings in urine: Secondary | ICD-10-CM

## 2022-08-08 DIAGNOSIS — N898 Other specified noninflammatory disorders of vagina: Secondary | ICD-10-CM

## 2022-08-08 DIAGNOSIS — Z113 Encounter for screening for infections with a predominantly sexual mode of transmission: Secondary | ICD-10-CM | POA: Diagnosis present

## 2022-08-08 DIAGNOSIS — R319 Hematuria, unspecified: Secondary | ICD-10-CM

## 2022-08-08 LAB — POCT URINALYSIS DIPSTICK
Appearance: NORMAL
Bilirubin, UA: NEGATIVE
Glucose, UA: NEGATIVE
Ketones, UA: NEGATIVE
Nitrite, UA: POSITIVE
Odor: ABNORMAL
Protein, UA: POSITIVE — AB
Spec Grav, UA: 1.025 (ref 1.010–1.025)
Urobilinogen, UA: 0.2 E.U./dL
pH, UA: 7 (ref 5.0–8.0)

## 2022-08-08 MED ORDER — NITROFURANTOIN MONOHYD MACRO 100 MG PO CAPS: 100.0000 mg | ORAL_CAPSULE | Freq: Two times a day (BID) | ORAL | 0 refills | Status: AC

## 2022-08-08 NOTE — Addendum Note (Signed)
Addended by: Harrie Jeans on: 08/08/2022 11:15 AM   Modules accepted: Orders

## 2022-08-08 NOTE — Progress Notes (Signed)
Pt presents to office with complaints of abnormal urine/vaginal odor. She reports recent intercourse with new partner. Clean catch urine obtained. Urine dip with positive nitrates. Rx sent to pharmacy for treatment. Pt instructed on and self swab performed as well. Advised that we would notify her once results are received, if treatment is needed. Pt also requests STD screening labs. Order placed.

## 2022-08-09 ENCOUNTER — Other Ambulatory Visit (HOSPITAL_BASED_OUTPATIENT_CLINIC_OR_DEPARTMENT_OTHER): Payer: Self-pay | Admitting: *Deleted

## 2022-08-09 LAB — RPR+HBSAG+HIV
HIV Screen 4th Generation wRfx: NONREACTIVE
Hepatitis B Surface Ag: NEGATIVE
RPR Ser Ql: NONREACTIVE

## 2022-08-09 LAB — CERVICOVAGINAL ANCILLARY ONLY
Bacterial Vaginitis (gardnerella): POSITIVE — AB
Candida Glabrata: NEGATIVE
Candida Vaginitis: NEGATIVE
Chlamydia: NEGATIVE
Comment: NEGATIVE
Comment: NEGATIVE
Comment: NEGATIVE
Comment: NEGATIVE
Comment: NEGATIVE
Comment: NORMAL
Neisseria Gonorrhea: NEGATIVE
Trichomonas: NEGATIVE

## 2022-08-09 MED ORDER — METRONIDAZOLE 0.75 % VA GEL: 1.0000 | Freq: Every day | VAGINAL | 0 refills | Status: DC

## 2022-08-09 MED ORDER — FLUOXETINE HCL 10 MG PO CAPS: 10.0000 mg | ORAL_CAPSULE | Freq: Every day | ORAL | 1 refills | Status: DC

## 2022-08-09 NOTE — Addendum Note (Signed)
Addended by: Jerene Bears on: 08/09/2022 01:12 PM   Modules accepted: Orders

## 2022-08-09 NOTE — Progress Notes (Signed)
Pharmacy requests prozac prescription be changed from tablets to capsules. Rx updated

## 2022-08-11 LAB — URINE CULTURE

## 2022-08-28 ENCOUNTER — Other Ambulatory Visit (HOSPITAL_BASED_OUTPATIENT_CLINIC_OR_DEPARTMENT_OTHER): Payer: 59

## 2022-08-28 ENCOUNTER — Ambulatory Visit (HOSPITAL_BASED_OUTPATIENT_CLINIC_OR_DEPARTMENT_OTHER): Payer: 59 | Admitting: Obstetrics & Gynecology

## 2022-08-29 ENCOUNTER — Ambulatory Visit
Admission: RE | Admit: 2022-08-29 | Discharge: 2022-08-29 | Disposition: A | Discharge status: Home / Self Care | Payer: 59 | Source: Ambulatory Visit | Attending: Obstetrics & Gynecology | Admitting: Obstetrics & Gynecology

## 2022-08-29 DIAGNOSIS — N852 Hypertrophy of uterus: Secondary | ICD-10-CM

## 2022-09-26 ENCOUNTER — Ambulatory Visit
Admission: RE | Admit: 2022-09-26 | Discharge: 2022-09-26 | Disposition: A | Discharge status: Home / Self Care | Payer: 59 | Source: Ambulatory Visit | Attending: Obstetrics & Gynecology | Admitting: Obstetrics & Gynecology

## 2022-09-26 DIAGNOSIS — Z1231 Encounter for screening mammogram for malignant neoplasm of breast: Secondary | ICD-10-CM

## 2022-10-09 ENCOUNTER — Encounter (HOSPITAL_BASED_OUTPATIENT_CLINIC_OR_DEPARTMENT_OTHER): Payer: Self-pay | Admitting: Obstetrics & Gynecology

## 2022-10-16 ENCOUNTER — Other Ambulatory Visit (HOSPITAL_BASED_OUTPATIENT_CLINIC_OR_DEPARTMENT_OTHER): Payer: Self-pay | Admitting: Obstetrics & Gynecology

## 2022-10-16 ENCOUNTER — Encounter (HOSPITAL_BASED_OUTPATIENT_CLINIC_OR_DEPARTMENT_OTHER): Payer: Self-pay | Admitting: *Deleted

## 2022-12-02 ENCOUNTER — Other Ambulatory Visit (HOSPITAL_BASED_OUTPATIENT_CLINIC_OR_DEPARTMENT_OTHER): Payer: Self-pay | Admitting: Family Medicine

## 2022-12-02 DIAGNOSIS — F909 Attention-deficit hyperactivity disorder, unspecified type: Secondary | ICD-10-CM

## 2022-12-12 ENCOUNTER — Telehealth (HOSPITAL_BASED_OUTPATIENT_CLINIC_OR_DEPARTMENT_OTHER): Payer: Self-pay | Admitting: Family Medicine

## 2022-12-12 NOTE — Telephone Encounter (Signed)
Spoke with patient, let her know that she is overdue for a follow up. Is supposed to follow up every 3 months for Vyvanse medication. States she was never told to follow up or called for a follow up. Apologized to patient, unsure how this was not taken care of. Patient still upset, let her know I can get her scheduled for a follow up, she refused and states she will give the office a call back.

## 2022-12-12 NOTE — Telephone Encounter (Signed)
Patient stated she is needing refill for [facility] very upset over this matter.

## 2023-02-05 ENCOUNTER — Other Ambulatory Visit (HOSPITAL_BASED_OUTPATIENT_CLINIC_OR_DEPARTMENT_OTHER): Payer: Self-pay | Admitting: Family Medicine

## 2023-02-22 DIAGNOSIS — Z0130 Encounter for examination of blood pressure without abnormal findings: Secondary | ICD-10-CM | POA: Diagnosis not present

## 2023-02-22 DIAGNOSIS — Z6833 Body mass index (BMI) 33.0-33.9, adult: Secondary | ICD-10-CM | POA: Diagnosis not present

## 2023-02-22 DIAGNOSIS — Z3200 Encounter for pregnancy test, result unknown: Secondary | ICD-10-CM | POA: Diagnosis not present

## 2023-02-22 DIAGNOSIS — W57XXXA Bitten or stung by nonvenomous insect and other nonvenomous arthropods, initial encounter: Secondary | ICD-10-CM | POA: Diagnosis not present

## 2023-02-22 DIAGNOSIS — R030 Elevated blood-pressure reading, without diagnosis of hypertension: Secondary | ICD-10-CM | POA: Diagnosis not present

## 2023-03-10 ENCOUNTER — Other Ambulatory Visit (HOSPITAL_BASED_OUTPATIENT_CLINIC_OR_DEPARTMENT_OTHER): Payer: Self-pay | Admitting: *Deleted

## 2023-03-10 MED ORDER — FLUOXETINE HCL 10 MG PO CAPS: 10.0000 mg | ORAL_CAPSULE | Freq: Every day | ORAL | 0 refills | Status: DC

## 2023-05-14 ENCOUNTER — Ambulatory Visit (HOSPITAL_BASED_OUTPATIENT_CLINIC_OR_DEPARTMENT_OTHER): Payer: Medicaid Other

## 2023-05-14 ENCOUNTER — Encounter (HOSPITAL_BASED_OUTPATIENT_CLINIC_OR_DEPARTMENT_OTHER): Payer: Self-pay

## 2023-05-14 ENCOUNTER — Other Ambulatory Visit (HOSPITAL_COMMUNITY)
Admission: RE | Admit: 2023-05-14 | Discharge: 2023-05-14 | Disposition: A | Discharge status: Home / Self Care | Payer: Medicaid Other | Source: Ambulatory Visit | Attending: Obstetrics & Gynecology | Admitting: Obstetrics & Gynecology

## 2023-05-14 VITALS — BP 108/69 | HR 62 | Wt 182.4 lb

## 2023-05-14 DIAGNOSIS — Z113 Encounter for screening for infections with a predominantly sexual mode of transmission: Secondary | ICD-10-CM

## 2023-05-14 NOTE — Progress Notes (Signed)
Patient came in today for STI testing. She states that her partner was seen in urgent care yesterday and was found to have uretratitis and that can be caused by STI. Patient obtained sample and the sample has been sent out for evaluation. tbw

## 2023-05-14 NOTE — Addendum Note (Signed)
Addended by: Ina Homes B on: 05/14/2023 10:53 AM   Modules accepted: Orders

## 2023-05-19 LAB — CERVICOVAGINAL ANCILLARY ONLY
Chlamydia: NEGATIVE
Comment: NEGATIVE
Comment: NEGATIVE
Comment: NORMAL
Neisseria Gonorrhea: NEGATIVE
Trichomonas: NEGATIVE

## 2023-06-23 DIAGNOSIS — R7303 Prediabetes: Secondary | ICD-10-CM | POA: Diagnosis not present

## 2023-06-23 DIAGNOSIS — E063 Autoimmune thyroiditis: Secondary | ICD-10-CM | POA: Diagnosis not present

## 2023-06-23 DIAGNOSIS — E669 Obesity, unspecified: Secondary | ICD-10-CM | POA: Diagnosis not present

## 2023-06-23 DIAGNOSIS — E041 Nontoxic single thyroid nodule: Secondary | ICD-10-CM | POA: Diagnosis not present

## 2023-06-23 DIAGNOSIS — Z808 Family history of malignant neoplasm of other organs or systems: Secondary | ICD-10-CM | POA: Diagnosis not present

## 2023-06-23 DIAGNOSIS — E559 Vitamin D deficiency, unspecified: Secondary | ICD-10-CM | POA: Diagnosis not present

## 2023-07-30 ENCOUNTER — Other Ambulatory Visit (HOSPITAL_BASED_OUTPATIENT_CLINIC_OR_DEPARTMENT_OTHER): Payer: Self-pay | Admitting: Family Medicine

## 2023-07-31 ENCOUNTER — Encounter (HOSPITAL_BASED_OUTPATIENT_CLINIC_OR_DEPARTMENT_OTHER): Payer: Self-pay | Admitting: *Deleted

## 2023-09-04 ENCOUNTER — Other Ambulatory Visit: Payer: Self-pay | Admitting: Obstetrics & Gynecology

## 2023-09-04 DIAGNOSIS — Z1231 Encounter for screening mammogram for malignant neoplasm of breast: Secondary | ICD-10-CM

## 2023-09-12 ENCOUNTER — Ambulatory Visit (HOSPITAL_BASED_OUTPATIENT_CLINIC_OR_DEPARTMENT_OTHER): Payer: Medicaid Other | Admitting: Family Medicine

## 2023-09-16 ENCOUNTER — Encounter (HOSPITAL_BASED_OUTPATIENT_CLINIC_OR_DEPARTMENT_OTHER): Payer: Self-pay | Admitting: Family Medicine

## 2023-09-16 ENCOUNTER — Ambulatory Visit (HOSPITAL_BASED_OUTPATIENT_CLINIC_OR_DEPARTMENT_OTHER): Payer: Medicaid Other | Admitting: Family Medicine

## 2023-09-16 VITALS — BP 124/78 | HR 76 | Ht 61.0 in | Wt 189.7 lb

## 2023-09-16 DIAGNOSIS — F909 Attention-deficit hyperactivity disorder, unspecified type: Secondary | ICD-10-CM

## 2023-09-16 DIAGNOSIS — R7303 Prediabetes: Secondary | ICD-10-CM | POA: Diagnosis not present

## 2023-09-16 DIAGNOSIS — M542 Cervicalgia: Secondary | ICD-10-CM | POA: Diagnosis not present

## 2023-09-16 DIAGNOSIS — F32A Depression, unspecified: Secondary | ICD-10-CM | POA: Diagnosis not present

## 2023-09-16 MED ORDER — LISDEXAMFETAMINE DIMESYLATE 10 MG PO CAPS: 10.0000 mg | ORAL_CAPSULE | Freq: Every day | ORAL | 0 refills | Status: DC

## 2023-09-16 NOTE — Patient Instructions (Signed)
 ?  Medication Instructions:  ?Your physician recommends that you continue on your current medications as directed. Please refer to the Current Medication list given to you today. ?--If you need a refill on any your medications before your next appointment, please call your pharmacy first. If no refills are authorized on file call the office.-- ? ? ? ?Follow-Up: ?Your next appointment:   ?Your physician recommends that you schedule a follow-up appointment in: 3 month follow-up with Dr. de [place] ? ?You will receive a text message or e-mail with a link to a survey about your care and experience with [place] today! We would greatly appreciate your feedback!  ? ?Thanks for letting us be apart of your health journey!!  ?Primary Care and Sports Medicine  ? ?Dr. Marcy Salvo de [place]  ? ?We encourage you to activate your patient portal called "MyChart".  Sign up information is provided on this After Visit Summary.  MyChart is used to connect with patients for Virtual Visits (Telemedicine).  Patients are able to view lab/test results, encounter notes, upcoming appointments, etc.  Non-urgent messages can be sent to your provider as well. To learn more about what you can do with MyChart, please visit --  [email or web address].    ?

## 2023-09-16 NOTE — Progress Notes (Signed)
    Procedures performed today:    None.  Independent interpretation of notes and tests performed by another provider:   None.  Brief History, Exam, Impression, and Recommendations:    BP 124/78 (BP Location: Left Arm, Patient Position: Sitting, Cuff Size: Normal)   Pulse 76   Ht 5' 1 (1.549 m)   Wt 189 lb 11.2 oz (86 kg)   SpO2 98%   BMI 35.84 kg/m   Depression, unspecified depression type Assessment & Plan: Has worked with therapist in the past, however due to insurance changes, she was not able to continue with that provider.  She is requesting referral to establish with new therapist locally, referral placed today  Orders: -     Ambulatory referral to Psychology  Attention deficit hyperactivity disorder (ADHD), unspecified ADHD type Assessment & Plan: Continues with Vyvanse  10 mg, requesting refill of medication today,  Reviewed PDMP, no red flags today, did have refill with another PCP last year, but no longer following with that provider now. Provided refill of Vyvanse   Orders: -     Lisdexamfetamine Dimesylate ; Take 1 capsule (10 mg total) by mouth daily.  Dispense: 30 capsule; Refill: 0  Neck pain Assessment & Plan: Started after competing at [employer], started about 2 weeks ago. Was primarily on left side, then started on right side this past weekend. Tried ibuprofen, topical cream, right side improved with this, left still present but improving.  Occasionally will have pain that radiates away from central location of pain source, this can be up into neck, down back. On exam, no tenderness to palpation through spinous processes within the cervical region.  She does have tenderness to palpation through periscapular muscles, primarily at upper medial border of the scapula.  Palpation over this area does reveal focal point with spasm noted, palpation of this area does lead to degree of radiating pain away from this location.  Normal range of motion of cervical spine and  bilateral shoulders.  Distal neurovascular exam intact for upper extremities. Suspect likely underlying trigger point.  Discussed recommendations related to management.  I do feel that patient would do well with conservative measures, working with physical therapy.  She would like to have PT completed with office where she has received treatment for, integrative therapies.  Referral has been placed.  Orders: -     Ambulatory referral to Physical Therapy  Prediabetes Assessment & Plan: Has been following with endocrinology primarily related to thyroid .  She reports that she was advised that she does have diagnosis of prediabetes with slight elevation in hemoglobin A1c on labs with endocrinology.  She does have some questions about this diagnosis today. We did review diagnosis of prediabetes, recommendations related to management.  Recommend primarily focusing on lifestyle modifications.  Did provide handout today as well which reviewed diagnosis and management considerations.  Recommend intermittent monitoring of hemoglobin A1c moving forward   Return in about 3 months (around 12/14/2023).   ___________________________________________ Robin Sansone de [place], MD, ABFM, CAQSM Primary Care and Sports Medicine [facility]

## 2023-09-16 NOTE — Assessment & Plan Note (Signed)
Continues with Vyvanse 10 mg, requesting refill of medication today,  Reviewed PDMP, no red flags today, did have refill with another PCP last year, but no longer following with that provider now. Provided refill of Vyvanse

## 2023-09-17 DIAGNOSIS — M542 Cervicalgia: Secondary | ICD-10-CM | POA: Insufficient documentation

## 2023-09-17 NOTE — Assessment & Plan Note (Signed)
 Has worked with therapist in the past, however due to insurance changes, she was not able to continue with that provider.  She is requesting referral to establish with new therapist locally, referral placed today

## 2023-09-17 NOTE — Assessment & Plan Note (Signed)
 Started after competing at [employer], started about 2 weeks ago. Was primarily on left side, then started on right side this past weekend. Tried ibuprofen, topical cream, right side improved with this, left still present but improving.  Occasionally will have pain that radiates away from central location of pain source, this can be up into neck, down back. On exam, no tenderness to palpation through spinous processes within the cervical region.  She does have tenderness to palpation through periscapular muscles, primarily at upper medial border of the scapula.  Palpation over this area does reveal focal point with spasm noted, palpation of this area does lead to degree of radiating pain away from this location.  Normal range of motion of cervical spine and bilateral shoulders.  Distal neurovascular exam intact for upper extremities. Suspect likely underlying trigger point.  Discussed recommendations related to management.  I do feel that patient would do well with conservative measures, working with physical therapy.  She would like to have PT completed with office where she has received treatment for, integrative therapies.  Referral has been placed.

## 2023-09-17 NOTE — Assessment & Plan Note (Signed)
 Has been following with endocrinology primarily related to thyroid .  She reports that she was advised that she does have diagnosis of prediabetes with slight elevation in hemoglobin A1c on labs with endocrinology.  She does have some questions about this diagnosis today. We did review diagnosis of prediabetes, recommendations related to management.  Recommend primarily focusing on lifestyle modifications.  Did provide handout today as well which reviewed diagnosis and management considerations.  Recommend intermittent monitoring of hemoglobin A1c moving forward

## 2023-09-30 ENCOUNTER — Ambulatory Visit
Admission: RE | Admit: 2023-09-30 | Discharge: 2023-09-30 | Disposition: A | Discharge status: Home / Self Care | Payer: Medicaid Other | Source: Ambulatory Visit | Attending: Obstetrics & Gynecology | Admitting: Obstetrics & Gynecology

## 2023-09-30 DIAGNOSIS — Z1231 Encounter for screening mammogram for malignant neoplasm of breast: Secondary | ICD-10-CM | POA: Diagnosis not present

## 2023-10-06 ENCOUNTER — Other Ambulatory Visit: Payer: Self-pay | Admitting: Obstetrics & Gynecology

## 2023-10-06 DIAGNOSIS — R928 Other abnormal and inconclusive findings on diagnostic imaging of breast: Secondary | ICD-10-CM

## 2023-10-07 ENCOUNTER — Encounter (HOSPITAL_BASED_OUTPATIENT_CLINIC_OR_DEPARTMENT_OTHER): Payer: Self-pay | Admitting: Obstetrics & Gynecology

## 2023-10-08 ENCOUNTER — Telehealth (HOSPITAL_BASED_OUTPATIENT_CLINIC_OR_DEPARTMENT_OTHER): Payer: Self-pay | Admitting: *Deleted

## 2023-10-08 NOTE — Telephone Encounter (Signed)
 Copied from CRM [id number]. Topic: Referral - Question >> Oct 07, 2023  5:01 PM Antony Haste wrote: Reason for CRM: The patient's referral to see a physical therapist at [facility] was placed on 02/04 during her previous visit with her PCP. She states she still has not received an update from their office to schedule, and would like if someone can make an appointment for her?

## 2023-10-08 NOTE — Telephone Encounter (Signed)
 They will not allow [place] to make the appointment for her. Being that the referral has been placed she can reach them at [phone]. Please let the patient know

## 2023-10-15 ENCOUNTER — Ambulatory Visit
Admission: RE | Admit: 2023-10-15 | Discharge: 2023-10-15 | Disposition: A | Discharge status: Home / Self Care | Payer: Medicaid Other | Source: Ambulatory Visit | Attending: Obstetrics & Gynecology | Admitting: Obstetrics & Gynecology

## 2023-10-15 ENCOUNTER — Ambulatory Visit: Payer: Medicaid Other

## 2023-10-15 DIAGNOSIS — R928 Other abnormal and inconclusive findings on diagnostic imaging of breast: Secondary | ICD-10-CM

## 2023-10-20 DIAGNOSIS — M25512 Pain in left shoulder: Secondary | ICD-10-CM | POA: Diagnosis not present

## 2023-10-20 DIAGNOSIS — M542 Cervicalgia: Secondary | ICD-10-CM | POA: Diagnosis not present

## 2023-11-10 DIAGNOSIS — M542 Cervicalgia: Secondary | ICD-10-CM | POA: Diagnosis not present

## 2023-11-10 DIAGNOSIS — M25512 Pain in left shoulder: Secondary | ICD-10-CM | POA: Diagnosis not present

## 2023-11-26 DIAGNOSIS — M25512 Pain in left shoulder: Secondary | ICD-10-CM | POA: Diagnosis not present

## 2023-11-26 DIAGNOSIS — M542 Cervicalgia: Secondary | ICD-10-CM | POA: Diagnosis not present

## 2023-12-03 DIAGNOSIS — M542 Cervicalgia: Secondary | ICD-10-CM | POA: Diagnosis not present

## 2023-12-03 DIAGNOSIS — M25512 Pain in left shoulder: Secondary | ICD-10-CM | POA: Insufficient documentation

## 2023-12-08 DIAGNOSIS — Z1152 Encounter for screening for COVID-19: Secondary | ICD-10-CM | POA: Diagnosis not present

## 2023-12-08 DIAGNOSIS — R498 Other voice and resonance disorders: Secondary | ICD-10-CM | POA: Diagnosis not present

## 2023-12-08 DIAGNOSIS — J029 Acute pharyngitis, unspecified: Secondary | ICD-10-CM | POA: Diagnosis not present

## 2023-12-08 DIAGNOSIS — Z3200 Encounter for pregnancy test, result unknown: Secondary | ICD-10-CM | POA: Diagnosis not present

## 2023-12-10 DIAGNOSIS — M25512 Pain in left shoulder: Secondary | ICD-10-CM | POA: Diagnosis not present

## 2023-12-10 DIAGNOSIS — M542 Cervicalgia: Secondary | ICD-10-CM | POA: Diagnosis not present

## 2023-12-15 ENCOUNTER — Ambulatory Visit (HOSPITAL_BASED_OUTPATIENT_CLINIC_OR_DEPARTMENT_OTHER): Payer: Medicaid Other | Admitting: Family Medicine

## 2023-12-17 DIAGNOSIS — M542 Cervicalgia: Secondary | ICD-10-CM | POA: Diagnosis not present

## 2023-12-17 DIAGNOSIS — M25512 Pain in left shoulder: Secondary | ICD-10-CM | POA: Diagnosis not present

## 2023-12-24 DIAGNOSIS — M25512 Pain in left shoulder: Secondary | ICD-10-CM | POA: Diagnosis not present

## 2023-12-24 DIAGNOSIS — M542 Cervicalgia: Secondary | ICD-10-CM | POA: Diagnosis not present

## 2023-12-31 DIAGNOSIS — M542 Cervicalgia: Secondary | ICD-10-CM | POA: Diagnosis not present

## 2023-12-31 DIAGNOSIS — M25512 Pain in left shoulder: Secondary | ICD-10-CM | POA: Diagnosis not present

## 2024-01-06 ENCOUNTER — Ambulatory Visit (HOSPITAL_BASED_OUTPATIENT_CLINIC_OR_DEPARTMENT_OTHER): Payer: Medicaid Other | Admitting: Obstetrics & Gynecology

## 2024-01-07 DIAGNOSIS — M542 Cervicalgia: Secondary | ICD-10-CM | POA: Diagnosis not present

## 2024-01-07 DIAGNOSIS — M25512 Pain in left shoulder: Secondary | ICD-10-CM | POA: Diagnosis not present

## 2024-01-22 ENCOUNTER — Ambulatory Visit (HOSPITAL_BASED_OUTPATIENT_CLINIC_OR_DEPARTMENT_OTHER): Admitting: Family Medicine

## 2024-01-22 ENCOUNTER — Encounter (HOSPITAL_BASED_OUTPATIENT_CLINIC_OR_DEPARTMENT_OTHER): Payer: Self-pay | Admitting: Family Medicine

## 2024-01-22 VITALS — BP 132/84 | HR 62 | Ht 61.0 in | Wt 187.0 lb

## 2024-01-22 DIAGNOSIS — G479 Sleep disorder, unspecified: Secondary | ICD-10-CM | POA: Insufficient documentation

## 2024-01-22 DIAGNOSIS — W57XXXA Bitten or stung by nonvenomous insect and other nonvenomous arthropods, initial encounter: Secondary | ICD-10-CM | POA: Diagnosis not present

## 2024-01-22 DIAGNOSIS — F909 Attention-deficit hyperactivity disorder, unspecified type: Secondary | ICD-10-CM | POA: Diagnosis not present

## 2024-01-22 DIAGNOSIS — Z0000 Encounter for general adult medical examination without abnormal findings: Secondary | ICD-10-CM

## 2024-01-22 MED ORDER — LISDEXAMFETAMINE DIMESYLATE 10 MG PO CAPS: 10.0000 mg | ORAL_CAPSULE | Freq: Every day | ORAL | 0 refills | Status: DC

## 2024-01-22 MED ORDER — PREDNISONE 20 MG PO TABS: 40.0000 mg | ORAL_TABLET | Freq: Every day | ORAL | 0 refills | Status: AC

## 2024-01-22 NOTE — Progress Notes (Signed)
    Procedures performed today:    None.  Independent interpretation of notes and tests performed by another provider:   None.  Brief History, Exam, Impression, and Recommendations:    BP 132/84 (BP Location: Right Arm, Patient Position: Sitting, Cuff Size: Normal)   Pulse 62   Ht 5' 1 (1.549 m)   Wt 187 lb (84.8 kg)   SpO2 99%   BMI 35.33 kg/m   Attention deficit hyperactivity disorder (ADHD), unspecified ADHD type Assessment & Plan: Continues with Vyvanse  10 mg, requesting refill of medication today, denies any issues with medication, no reported issues with chest pain, sleep issues. Reviewed PDMP, no red flags today. Provided refill of Vyvanse   Orders: -     Lisdexamfetamine Dimesylate ; Take 1 capsule (10 mg total) by mouth daily.  Dispense: 30 capsule; Refill: 0  Wellness examination -     CBC with Differential/Platelet; Future -     Comprehensive metabolic panel with GFR; Future -     Hemoglobin A1c; Future -     Lipid panel; Future -     TSH Rfx on Abnormal to Free T4; Future  Insect bite, unspecified site, initial encounter Assessment & Plan: Patient reports developing lesions recently over trunk primarily.  Based on appearance, patient feels that it could be related to insect bite.  She has had some itching as well as mild discomfort over these areas.  Denies any systemic issues, no fever, fatigue. On exam, patient is in no acute distress, vital signs stable.  She does have a few raised lesions scattered over trunk with mild erythema surrounding these.  Slight warmth to the touch, blanching.  No fluctuance noted. Areas do appear most likely related to insect bite.  Discussed treatment considerations, reviewed topical medications which can be utilized.  We can also provide short course of oral steroid to help with symptoms given number of bites present over trunk.  Cautioned on potential side effects related to steroid use   Other orders -     predniSONE ; Take 2  tablets (40 mg total) by mouth daily with breakfast for 5 days.  Dispense: 10 tablet; Refill: 0  Return in about 3 months (around 04/23/2024) for CPE with fasting labs 1 week prior.   ___________________________________________ Robin Lucking de [place], MD, ABFM, CAQSM Primary Care and Sports Medicine [facility]

## 2024-01-22 NOTE — Patient Instructions (Signed)
   Medication Instructions:  Your physician recommends that you continue on your current medications as directed. Please refer to the Current Medication list given to you today. --If you need a refill on any your medications before your next appointment, please call your pharmacy first. If no refills are authorized on file call the office.-- Lab Work: Your physician has recommended that you have lab work today: 1 week before next visit  If you have labs (blood work) drawn today and your tests are completely normal, you will receive your results via MyChart message OR a phone call from our staff.  Please ensure you check your voicemail in the event that you authorized detailed messages to be left on a delegated number. If you have any lab test that is abnormal or we need to change your treatment, we will call you to review the results.   Follow-Up: Your next appointment:   Your physician recommends that you schedule a follow-up appointment in: 3 months physical with Dr. de [place]  You will receive a text message or e-mail with a link to a survey about your care and experience with [place] today! We would greatly appreciate your feedback!   Thanks for letting us be apart of your health journey!!  Primary Care and Sports Medicine   Dr. Ceasar Mons Peru   We encourage you to activate your patient portal called "MyChart".  Sign up information is provided on this After Visit Summary.  MyChart is used to connect with patients for Virtual Visits (Telemedicine).  Patients are able to view lab/test results, encounter notes, upcoming appointments, etc.  Non-urgent messages can be sent to your provider as well. To learn more about what you can do with MyChart, please visit --  [email or web address].

## 2024-02-25 DIAGNOSIS — W57XXXA Bitten or stung by nonvenomous insect and other nonvenomous arthropods, initial encounter: Secondary | ICD-10-CM | POA: Insufficient documentation

## 2024-02-25 NOTE — Assessment & Plan Note (Signed)
 Continues with Vyvanse  10 mg, requesting refill of medication today, denies any issues with medication, no reported issues with chest pain, sleep issues. Reviewed PDMP, no red flags today. Provided refill of Vyvanse 

## 2024-02-26 NOTE — Assessment & Plan Note (Signed)
 Patient reports developing lesions recently over trunk primarily.  Based on appearance, patient feels that it could be related to insect bite.  She has had some itching as well as mild discomfort over these areas.  Denies any systemic issues, no fever, fatigue. On exam, patient is in no acute distress, vital signs stable.  She does have a few raised lesions scattered over trunk with mild erythema surrounding these.  Slight warmth to the touch, blanching.  No fluctuance noted. Areas do appear most likely related to insect bite.  Discussed treatment considerations, reviewed topical medications which can be utilized.  We can also provide short course of oral steroid to help with symptoms given number of bites present over trunk.  Cautioned on potential side effects related to steroid use

## 2024-04-20 ENCOUNTER — Ambulatory Visit (HOSPITAL_BASED_OUTPATIENT_CLINIC_OR_DEPARTMENT_OTHER): Admitting: Obstetrics & Gynecology

## 2024-04-26 NOTE — Progress Notes (Unsigned)
 ANNUAL EXAM Patient name: Robin Burke MRN [id number]  Date of birth: 09/20/1980 Chief Complaint:   No chief complaint on file.  History of Present Illness:   Robin Burke is a 43 y.o. G0P0000 Hispanic female being seen today for a routine annual exam.  Current complaints: ***  No LMP recorded.   The pregnancy intention screening data noted above was reviewed. Potential methods of contraception were discussed. The patient elected to proceed with No data recorded.   Last pap 06/20/2022. Results were: NILM w/ HRHPV negative. H/O abnormal pap: yes Last mammogram: 09/30/2023. Results were: abnormal Birads 0. 3D diagnostic mammogram of right breast 10/15/2023 negative. Family h/o breast cancer: {yes***/no:23838} Last colonoscopy: N/A. Results were: N/A. Family h/o colorectal cancer: {yes***/no:23838}     01/22/2024    9:45 AM 09/16/2023    4:04 PM 05/14/2023   10:29 AM 06/20/2022    1:33 PM 01/08/2022    1:52 PM  Depression screen PHQ 2/9  Decreased Interest 0 0 0 0 0  Down, Depressed, Hopeless 0 2 0 0 1  PHQ - 2 Score 0 2 0 0 1  Altered sleeping 0 2   2  Tired, decreased energy 0 2   2  Change in appetite 0 2   2  Feeling bad or failure about yourself  0 1   1  Trouble concentrating 0 3   1  Moving slowly or fidgety/restless 0 0   2  Suicidal thoughts 0 0   0  PHQ-9 Score 0 12   11  Difficult doing work/chores Not difficult at all Somewhat difficult   Somewhat difficult        01/22/2024    9:45 AM 09/16/2023    4:05 PM 01/08/2022    1:53 PM  GAD 7 : Generalized Anxiety Score  Nervous, Anxious, on Edge 3 1 3   Control/stop worrying 2 0 0  Worry too much - different things 2 0 0  Trouble relaxing 2 0 3  Restless 3 3 3   Easily annoyed or irritable 2 2 3   Afraid - awful might happen 0 1 0  Total GAD 7 Score 14 7 12   Anxiety Difficulty Somewhat difficult Somewhat difficult Somewhat difficult     Review of Systems:   Pertinent items are noted in HPI Denies any  headaches, blurred vision, fatigue, shortness of breath, chest pain, abdominal pain, abnormal vaginal discharge/itching/odor/irritation, problems with periods, bowel movements, urination, or intercourse unless otherwise stated above. Pertinent History Reviewed:  Reviewed past medical,surgical, social and family history.  Reviewed problem list, medications and allergies. Physical Assessment:  There were no vitals filed for this visit.There is no height or weight on file to calculate BMI.        Physical Examination:   General appearance - well appearing, and in no distress  Mental status - alert, oriented to person, place, and time  Psych:  She has a normal mood and affect  Skin - warm and dry, normal color, no suspicious lesions noted  Chest - effort normal, all lung fields clear to auscultation bilaterally  Heart - normal rate and regular rhythm  Neck:  midline trachea, no thyromegaly or nodules  Breasts - breasts appear normal, no suspicious masses, no skin or nipple changes or  axillary nodes  Abdomen - soft, nontender, nondistended, no masses or organomegaly  Pelvic - VULVA: normal appearing vulva with no masses, tenderness or lesions  VAGINA: normal appearing vagina with normal color and discharge, no lesions  CERVIX: normal appearing cervix without discharge or lesions, no CMT  Thin prep pap is {Desc; done/not:10129} *** HR HPV cotesting  UTERUS: uterus is felt to be normal size, shape, consistency and nontender   ADNEXA: No adnexal masses or tenderness noted.  Rectal - normal rectal, good sphincter tone, no masses felt. Hemoccult: ***  Extremities:  No swelling or varicosities noted  Chaperone present for exam  No results found for this or any previous visit (from the past 24 hours).  Assessment & Plan:  1) Well-Woman Exam  2) ***  Labs/procedures today: ***  Mammogram: {Mammo f/u:25212::@ 43yo}, or sooner if problems Colonoscopy: {TCS f/u:25213::@ 43yo}, or sooner if  problems  No orders of the defined types were placed in this encounter.   Meds: No orders of the defined types were placed in this encounter.   Follow-up: No follow-ups on file.  Sprague, Kaitlyn E, RN 04/26/2024 2:41 PM

## 2024-04-27 ENCOUNTER — Ambulatory Visit (INDEPENDENT_AMBULATORY_CARE_PROVIDER_SITE_OTHER): Admitting: Obstetrics & Gynecology

## 2024-04-27 ENCOUNTER — Other Ambulatory Visit (HOSPITAL_COMMUNITY)
Admission: RE | Admit: 2024-04-27 | Discharge: 2024-04-27 | Disposition: A | Discharge status: Home / Self Care | Source: Ambulatory Visit | Attending: Obstetrics & Gynecology | Admitting: Obstetrics & Gynecology

## 2024-04-27 ENCOUNTER — Encounter (HOSPITAL_BASED_OUTPATIENT_CLINIC_OR_DEPARTMENT_OTHER): Payer: Self-pay | Admitting: Obstetrics & Gynecology

## 2024-04-27 VITALS — BP 116/64 | HR 62 | Ht 61.0 in | Wt 184.0 lb

## 2024-04-27 DIAGNOSIS — Z202 Contact with and (suspected) exposure to infections with a predominantly sexual mode of transmission: Secondary | ICD-10-CM | POA: Diagnosis not present

## 2024-04-27 DIAGNOSIS — Z1331 Encounter for screening for depression: Secondary | ICD-10-CM | POA: Diagnosis not present

## 2024-04-27 DIAGNOSIS — Z01419 Encounter for gynecological examination (general) (routine) without abnormal findings: Secondary | ICD-10-CM

## 2024-04-27 DIAGNOSIS — Z9889 Other specified postprocedural states: Secondary | ICD-10-CM

## 2024-04-27 DIAGNOSIS — Z8742 Personal history of other diseases of the female genital tract: Secondary | ICD-10-CM | POA: Insufficient documentation

## 2024-04-27 DIAGNOSIS — E041 Nontoxic single thyroid nodule: Secondary | ICD-10-CM

## 2024-04-28 ENCOUNTER — Ambulatory Visit (HOSPITAL_BASED_OUTPATIENT_CLINIC_OR_DEPARTMENT_OTHER): Payer: Self-pay | Admitting: Obstetrics & Gynecology

## 2024-04-28 LAB — RPR+HBSAG+HIV
HIV Screen 4th Generation wRfx: NONREACTIVE
Hepatitis B Surface Ag: NEGATIVE
RPR Ser Ql: NONREACTIVE

## 2024-04-28 LAB — HEPATITIS C ANTIBODY: Hep C Virus Ab: NONREACTIVE

## 2024-04-29 LAB — CYTOLOGY - PAP
Adequacy: ABSENT
Chlamydia: NEGATIVE
Comment: NEGATIVE
Comment: NEGATIVE
Comment: NORMAL
Diagnosis: NEGATIVE
High risk HPV: NEGATIVE
Neisseria Gonorrhea: NEGATIVE

## 2024-05-05 ENCOUNTER — Other Ambulatory Visit (HOSPITAL_BASED_OUTPATIENT_CLINIC_OR_DEPARTMENT_OTHER): Payer: Self-pay | Admitting: Family Medicine

## 2024-05-05 ENCOUNTER — Ambulatory Visit (HOSPITAL_BASED_OUTPATIENT_CLINIC_OR_DEPARTMENT_OTHER)

## 2024-05-05 ENCOUNTER — Ambulatory Visit (HOSPITAL_BASED_OUTPATIENT_CLINIC_OR_DEPARTMENT_OTHER): Admitting: Student

## 2024-05-05 DIAGNOSIS — Z0000 Encounter for general adult medical examination without abnormal findings: Secondary | ICD-10-CM | POA: Diagnosis not present

## 2024-05-05 DIAGNOSIS — Z13220 Encounter for screening for lipoid disorders: Secondary | ICD-10-CM | POA: Diagnosis not present

## 2024-05-05 DIAGNOSIS — Z131 Encounter for screening for diabetes mellitus: Secondary | ICD-10-CM | POA: Diagnosis not present

## 2024-05-05 DIAGNOSIS — M722 Plantar fascial fibromatosis: Secondary | ICD-10-CM

## 2024-05-05 DIAGNOSIS — M79672 Pain in left foot: Secondary | ICD-10-CM | POA: Diagnosis not present

## 2024-05-05 NOTE — Progress Notes (Signed)
 Chief Complaint: Left foot pain    Discussed the use of AI scribe software for clinical note transcription with the patient, who gave verbal consent to proceed.  History of Present Illness Robin Burke is a 43 year old female who presents with sharp pains in the bottom of her left foot.  The pain has been present for approximately two and a half months, with a sensation akin to 'walking on pins and needles', more intense in the left foot. It began after walking 40,000 steps in one day during the [employer]. The pain is most severe in the morning, described as 'unbearable' upon getting out of bed. She manages the pain by rolling her foot on an ice bottle and performing stretches, though these measures have not provided significant relief. Ice application causes discomfort. She has not used any medications. 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 She has not tried any shoe inserts yet but has purchased new sneakers for better support.  Surgical History:   None  PMH/PSH/Family History/Social History/Meds/Allergies:    Past Medical History:  Diagnosis Date   HPV in female 2006   w/cryo, normal since   STD (female) 2010   HSV I & II  by serology   Thyroid  nodule    Past Surgical History:  Procedure Laterality Date   CHOLECYSTECTOMY N/A 01/22/2017   Procedure: LAPAROSCOPIC CHOLECYSTECTOMY;  Surgeon: Rubin Calamity, MD;  Location[facility] OR;  Service: General;  Laterality: N/A;   laprosopic myomectomy  05/11/2021   thyroid  removed     WISDOM TOOTH EXTRACTION  08/12/2005   Social History   Socioeconomic History   Marital status: Single    Spouse name: Not on file   Number of children: 0   Years of education: Not on file   Highest education level: Not on file  Occupational History   Occupation: Event planner    Comment: [employer]  Tobacco Use   Smoking status: Former    Current packs/day: 0.00     Average packs/day: 0.3 packs/day for 10.0 years (2.5 ttl pk-yrs)    Types: Cigarettes    Start date: 09/12/2001    Quit date: 09/13/2011    Years since quitting: 12.6    Passive exposure: Past   Smokeless tobacco: Never  Vaping Use   Vaping status: Never Used  Substance and Sexual Activity   Alcohol use: Yes    Alcohol/week: 3.0 standard drinks of alcohol    Types: 3 Standard drinks or equivalent per week   Drug use: No   Sexual activity: Not Currently    Partners: Male    Birth control/protection: None  Other Topics Concern   Not on file  Social History Narrative   She moved to [place] one half years ago. She previously lived in [place] and in [place] .   She is half [place] and half [place].   She works for [employer]   Social Drivers of Corporate investment banker Strain: Not on [employer] Insecurity: Not on file  Transportation Needs: Not on file  Physical Activity: Not on file  Stress: Not on file  Social Connections: Not on file   Family History  Problem Relation Age of Onset   Thyroid  cancer Maternal Grandmother    Osteoarthritis Maternal Grandmother  Dementia Maternal Grandmother    Coronary artery disease Father        Had 1 stent placed   Diabetes Mother    Hyperlipidemia Mother    Diabetes Brother    No Known Allergies Current Outpatient Medications  Medication Sig Dispense Refill   [employer] VITAMIN D3 250 MCG (10000 UT) CAPS TAKE 1 CAPSULE BY MOUTH ONE TIME PER WEEK (Patient not taking: Reported on 04/27/2024) 15 capsule 1   lisdexamfetamine (VYVANSE ) 10 MG capsule Take 1 capsule (10 mg total) by mouth daily. 30 capsule 0   Multiple Vitamins-Minerals (ZINC PO) Take by mouth.     No current facility-administered medications for this visit.   No results found.  Review of Systems:   A ROS was performed including pertinent positives and negatives as documented in the HPI.  Physical Exam :   Constitutional: NAD and appears stated  age Neurological: Alert and oriented Psych: Appropriate affect and cooperative Last menstrual period 04/19/2024.   Comprehensive Musculoskeletal Exam:    Exam of the left foot demonstrates tenderness over the plantar aspect of the calcaneus and proximal plantar fascia.  No significant swelling is present.  Mild pes planus.  No tenderness of the ankle with full range of motion.  Imaging:   Xray (left foot 3 views): Negative for bony abnormality   I personally reviewed and interpreted the radiographs.      Assessment & Plan Plantar fasciitis   Chronic bilateral plantar fasciitis is more severe on the left side. X-rays show no bone spurs or fractures. The condition is likely worsened by occupational standing and walking, with conservative measures providing limited relief. Refer to Dr. Burnetta for evaluation and management, including orthotics, injections, or shockwave therapy. Continue stretching and ice rolling. Can consider over-the-counter inserts for plantar fasciitis.      I personally saw and evaluated the patient, and participated in the management and treatment plan.  Leonce Reveal, PA-C Orthopedics

## 2024-05-05 NOTE — Progress Notes (Signed)
 Patient advising she has left foot pain that has been having pain x 1 month. Patient advising she has not had any injuries to her foot

## 2024-05-06 LAB — LIPID PANEL
Chol/HDL Ratio: 3.4 ratio (ref 0.0–4.4)
Cholesterol, Total: 213 mg/dL — ABNORMAL HIGH (ref 100–199)
HDL: 63 mg/dL (ref >39)
LDL Chol Calc (NIH): 139 mg/dL — ABNORMAL HIGH (ref 0–99)
Triglycerides: 60 mg/dL (ref 0–149)
VLDL Cholesterol Cal: 11 mg/dL (ref 5–40)

## 2024-05-06 LAB — COMPREHENSIVE METABOLIC PANEL WITH GFR
ALT: 28 IU/L (ref 0–32)
AST: 17 IU/L (ref 0–40)
Albumin: 4.5 g/dL (ref 3.9–4.9)
Alkaline Phosphatase: 69 IU/L (ref 41–116)
BUN/Creatinine Ratio: 23 (ref 9–23)
BUN: 20 mg/dL (ref 6–24)
Bilirubin Total: 0.6 mg/dL (ref 0.0–1.2)
CO2: 21 mmol/L (ref 20–29)
Calcium: 9.2 mg/dL (ref 8.7–10.2)
Chloride: 106 mmol/L (ref 96–106)
Creatinine, Ser: 0.87 mg/dL (ref 0.57–1.00)
Globulin, Total: 2.2 g/dL (ref 1.5–4.5)
Glucose: 93 mg/dL (ref 70–99)
Potassium: 5.2 mmol/L (ref 3.5–5.2)
Sodium: 140 mmol/L (ref 134–144)
Total Protein: 6.7 g/dL (ref 6.0–8.5)
eGFR: 85 mL/min/1.73 (ref >59)

## 2024-05-06 LAB — CBC WITH DIFFERENTIAL/PLATELET
Basophils Absolute: 0 x10E3/uL (ref 0.0–0.2)
Basos: 1 %
EOS (ABSOLUTE): 0.2 x10E3/uL (ref 0.0–0.4)
Eos: 3 %
Hematocrit: 44.4 % (ref 34.0–46.6)
Hemoglobin: 13.5 g/dL (ref 11.1–15.9)
Immature Grans (Abs): 0 x10E3/uL (ref 0.0–0.1)
Immature Granulocytes: 0 %
Lymphocytes Absolute: 1.6 x10E3/uL (ref 0.7–3.1)
Lymphs: 26 %
MCH: 25.2 pg — ABNORMAL LOW (ref 26.6–33.0)
MCHC: 30.4 g/dL — ABNORMAL LOW (ref 31.5–35.7)
MCV: 83 fL (ref 79–97)
Monocytes Absolute: 0.4 x10E3/uL (ref 0.1–0.9)
Monocytes: 7 %
Neutrophils Absolute: 4 x10E3/uL (ref 1.4–7.0)
Neutrophils: 63 %
Platelets: 238 x10E3/uL (ref 150–450)
RBC: 5.36 x10E6/uL — ABNORMAL HIGH (ref 3.77–5.28)
RDW: 13.7 % (ref 11.7–15.4)
WBC: 6.3 x10E3/uL (ref 3.4–10.8)

## 2024-05-06 LAB — HEMOGLOBIN A1C
Est. average glucose Bld gHb Est-mCnc: 111 mg/dL
Hgb A1c MFr Bld: 5.5 % (ref 4.8–5.6)

## 2024-05-06 LAB — TSH RFX ON ABNORMAL TO FREE T4: TSH: 2.16 u[IU]/mL (ref 0.450–4.500)

## 2024-05-07 ENCOUNTER — Ambulatory Visit (HOSPITAL_BASED_OUTPATIENT_CLINIC_OR_DEPARTMENT_OTHER): Payer: Self-pay | Admitting: Family Medicine

## 2024-05-10 ENCOUNTER — Other Ambulatory Visit (HOSPITAL_BASED_OUTPATIENT_CLINIC_OR_DEPARTMENT_OTHER): Payer: Self-pay | Admitting: *Deleted

## 2024-05-13 ENCOUNTER — Encounter (HOSPITAL_BASED_OUTPATIENT_CLINIC_OR_DEPARTMENT_OTHER): Payer: Self-pay | Admitting: Family Medicine

## 2024-05-13 ENCOUNTER — Ambulatory Visit (INDEPENDENT_AMBULATORY_CARE_PROVIDER_SITE_OTHER): Admitting: Family Medicine

## 2024-05-13 VITALS — BP 96/61 | HR 73 | Ht 61.0 in | Wt 186.2 lb

## 2024-05-13 DIAGNOSIS — E559 Vitamin D deficiency, unspecified: Secondary | ICD-10-CM | POA: Diagnosis not present

## 2024-05-13 DIAGNOSIS — R5383 Other fatigue: Secondary | ICD-10-CM

## 2024-05-13 DIAGNOSIS — Z0000 Encounter for general adult medical examination without abnormal findings: Secondary | ICD-10-CM | POA: Diagnosis not present

## 2024-05-13 DIAGNOSIS — F909 Attention-deficit hyperactivity disorder, unspecified type: Secondary | ICD-10-CM | POA: Diagnosis not present

## 2024-05-13 MED ORDER — LISDEXAMFETAMINE DIMESYLATE 20 MG PO CAPS: 20.0000 mg | ORAL_CAPSULE | Freq: Every day | ORAL | 0 refills | Status: DC

## 2024-05-13 NOTE — Progress Notes (Signed)
 Subjective:    CC: Annual Physical Exam  HPI:  Robin Burke is a 43 y.o. presenting for annual physical  I reviewed the past medical history, family history, social history, surgical history, and allergies today and no changes were needed.  Please see the problem list section below in epic for further details.  Past Medical History: Past Medical History:  Diagnosis Date   Anxiety    Depression Spring 2021   HPV in female 2006   w/cryo, normal since   STD (female) 2010   HSV I & II  by serology   Thyroid  nodule    Past Surgical History: Past Surgical History:  Procedure Laterality Date   CHOLECYSTECTOMY N/A 01/22/2017   Procedure: LAPAROSCOPIC CHOLECYSTECTOMY;  Surgeon: Rubin Calamity, MD;  Location: [facility] OR;  Service: General;  Laterality: N/A;   laprosopic myomectomy  05/11/2021   thyroid  removed     WISDOM TOOTH EXTRACTION  08/12/2005   Social History: Social History   Socioeconomic History   Marital status: Single    Spouse name: Not on file   Number of children: 0   Years of education: Not on file   Highest education level: Not on file  Occupational History   Occupation: Event planner    Comment: Corporate  Tobacco Use   Smoking status: Former    Current packs/day: 0.00    Average packs/day: 0.3 packs/day for 10.0 years (2.5 ttl pk-yrs)    Types: Cigarettes    Start date: 09/12/2001    Quit date: 09/13/2011    Years since quitting: 12.6    Passive exposure: Past   Smokeless tobacco: Never  Vaping Use   Vaping status: Never Used  Substance and Sexual Activity   Alcohol use: Yes    Alcohol/week: 3.0 standard drinks of alcohol    Types: 3 Standard drinks or equivalent per week   Drug use: No   Sexual activity: Not Currently    Partners: Male    Birth control/protection: None  Other Topics Concern   Not on file  Social History Narrative   She moved to [place] one half years ago. She previously lived in [place] and in [place] .   She is half  [place] and half [place].   She works for [employer]   Social Drivers of Corporate investment banker Strain: Not on [employer] Insecurity: Not on file  Transportation Needs: Not on file  Physical Activity: Not on file  Stress: Not on file  Social Connections: Not on file   Family History: Family History  Problem Relation Age of Onset   Thyroid  cancer Maternal Grandmother    Osteoarthritis Maternal Grandmother    Dementia Maternal Grandmother    Cancer Maternal Grandmother    Depression Maternal Grandmother    Diabetes Maternal Grandmother    Stroke Maternal Grandmother    Coronary artery disease Father        Had 1 stent placed   ADD / ADHD Father    Heart disease Father    Diabetes Mother    Hyperlipidemia Mother    Cancer Mother    Depression Mother    Obesity Mother    Diabetes Brother    Cancer Maternal Aunt    Allergies: No Known Allergies Medications: See med rec.  Review of Systems: No headache, visual changes, nausea, vomiting, diarrhea, constipation, dizziness, abdominal pain, skin rash, fevers, chills, night sweats, swollen lymph nodes, weight loss, chest pain, body aches, joint swelling, muscle aches, shortness of  breath, mood changes, visual or auditory hallucinations.  Objective:    BP 96/61 (BP Location: Right Arm, Patient Position: Sitting, Cuff Size: Normal)   Pulse 73   Ht 5' 1 (1.549 m)   Wt 186 lb 3.2 oz (84.5 kg)   LMP 04/19/2024 (Approximate)   SpO2 98%   BMI 35.18 kg/m   General: Well Developed, well nourished, and in no acute distress. Neuro: Alert and oriented x3, extra-ocular muscles intact, sensation grossly intact. Cranial nerves II through XII are intact, motor, sensory, and coordinative functions are all intact. HEENT: Normocephalic, atraumatic, pupils equal round reactive to light, neck supple, no masses, no lymphadenopathy, thyroid  nonpalpable. Oropharynx, nasopharynx, external ear canals are unremarkable. Skin: Warm and  dry, no rashes noted. Cardiac: Regular rate and rhythm, no murmurs rubs or gallops. Respiratory: Clear to auscultation bilaterally. Not using accessory muscles, speaking in full sentences. Abdominal: Soft, nontender, nondistended, positive bowel sounds, no masses, no organomegaly. Musculoskeletal: Shoulder, elbow, wrist, hip, knee, ankle stable, and with full range of motion.  Impression and Recommendations:    Wellness examination Assessment & Plan: Routine HCM labs reviewed. HCM reviewed/discussed. Anticipatory guidance regarding healthy weight, lifestyle and choices given. Recommend healthy diet.  Recommend approximately 150 minutes/week of moderate intensity exercise Recommend regular dental and vision exams Always use seatbelt/lap and shoulder restraints Recommend using smoke alarms and checking batteries at least twice a year Recommend using sunscreen when outside Discussed immunization recommendations   Attention deficit hyperactivity disorder (ADHD), unspecified ADHD type Assessment & Plan: Continues with Vyvanse  10 mg, thinks that symptoms have not been as well controlled. Denies any issues with medication, no reported issues with chest pain, sleep issues. Reviewed PDMP, no red flags today. Provided new prescription of Vyvanse   Orders: -     Lisdexamfetamine Dimesylate ; Take 1 capsule (20 mg total) by mouth daily.  Dispense: 30 capsule; Refill: 0  Fatigue, unspecified type Assessment & Plan: Patient has been having ongoing fatigue.  She finds that throughout the day she will find herself very tired and feeling as though she may easily fall asleep.  Recent labs did not show any evidence of anemia, normal thyroid  function was seen. She is not aware of any snoring and has not been told of any apneic episodes.  She indicates that she does get recommend number of hours of sleep at night. We discussed considerations, discussed potential underlying sleep apnea as well as typical  evaluation for this.  At this time, she would like to continue monitoring symptoms and will let us  know if she ultimately would like to meet with sleep medicine specialist.   Vitamin D  deficiency -     VITAMIN D  25 Hydroxy (Vit-D Deficiency, Fractures)  Return in about 2 months (around 07/13/2024) for med check.   ___________________________________________ Robin Schoenberger de Peru, MD, ABFM, CAQSM Primary Care and Sports Medicine [facility]

## 2024-05-13 NOTE — Patient Instructions (Signed)
  Medication Instructions:  Your physician recommends that you continue on your current medications as directed. Please refer to the Current Medication list given to you today. --If you need a refill on any your medications before your next appointment, please call your pharmacy first. If no refills are authorized on file call the office.-- Lab Work: Your physician has recommended that you have lab work today: today If you have labs (blood work) drawn today and your tests are completely normal, you will receive your results via MyChart message OR a phone call from our staff.  Please ensure you check your voicemail in the event that you authorized detailed messages to be left on a delegated number. If you have any lab test that is abnormal or we need to change your treatment, we will call you to review the results.  Follow-Up: Your next appointment:   Your physician recommends that you schedule a follow-up appointment in:  2 months follow up  with Dr. de Peru  You will receive a text message or e-mail with a link to a survey about your care and experience with [place] today! We would greatly appreciate your feedback!   Thanks for letting us be apart of your health journey!!  Primary Care and Sports Medicine   Dr. Ceasar Mons Peru   We encourage you to activate your patient portal called "MyChart".  Sign up information is provided on this After Visit Summary.  MyChart is used to connect with patients for Virtual Visits (Telemedicine).  Patients are able to view lab/test results, encounter notes, upcoming appointments, etc.  Non-urgent messages can be sent to your provider as well. To learn more about what you can do with MyChart, please visit --  [email or web address].

## 2024-05-13 NOTE — Assessment & Plan Note (Signed)
 Continues with Vyvanse  10 mg, thinks that symptoms have not been as well controlled. Denies any issues with medication, no reported issues with chest pain, sleep issues. Reviewed PDMP, no red flags today. Provided new prescription of Vyvanse 

## 2024-05-13 NOTE — Assessment & Plan Note (Signed)
 Patient has been having ongoing fatigue.  She finds that throughout the day she will find herself very tired and feeling as though she may easily fall asleep.  Recent labs did not show any evidence of anemia, normal thyroid  function was seen. She is not aware of any snoring and has not been told of any apneic episodes.  She indicates that she does get recommend number of hours of sleep at night. We discussed considerations, discussed potential underlying sleep apnea as well as typical evaluation for this.  At this time, she would like to continue monitoring symptoms and will let us  know if she ultimately would like to meet with sleep medicine specialist.

## 2024-05-13 NOTE — Assessment & Plan Note (Signed)

## 2024-05-14 ENCOUNTER — Ambulatory Visit (HOSPITAL_BASED_OUTPATIENT_CLINIC_OR_DEPARTMENT_OTHER): Payer: Self-pay | Admitting: Family Medicine

## 2024-05-14 LAB — VITAMIN D 25 HYDROXY (VIT D DEFICIENCY, FRACTURES): Vit D, 25-Hydroxy: 22.7 ng/mL — ABNORMAL LOW (ref 30.0–100.0)

## 2024-05-25 ENCOUNTER — Encounter: Payer: Self-pay | Admitting: Sports Medicine

## 2024-05-25 ENCOUNTER — Ambulatory Visit: Admitting: Sports Medicine

## 2024-05-25 DIAGNOSIS — G8929 Other chronic pain: Secondary | ICD-10-CM | POA: Diagnosis not present

## 2024-05-25 DIAGNOSIS — M79672 Pain in left foot: Secondary | ICD-10-CM

## 2024-05-25 DIAGNOSIS — M722 Plantar fascial fibromatosis: Secondary | ICD-10-CM

## 2024-05-25 NOTE — Progress Notes (Signed)
 Patient says that she has had left foot pain since July. She points to the arch and into the bottom of the heel when describing her pain. She has been stretching, although she has not noticed that stretching has been helpful; her pain has worsened over time. 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 She has gotten insoles that provide softer cushion, which she prefers. She is interested in discussing insoles today, as well as shockwave therapy.  Patient was instructed in 10 minutes of therapeutic exercises for left foot to improve strength, ROM and function according to my instructions and plan of care by a Certified Athletic Trainer during the office visit. A customized handout was provided and demonstration of proper technique shown and discussed. Patient did perform exercises and demonstrate understanding through teachback.  All questions discussed and answered.

## 2024-05-25 NOTE — Progress Notes (Addendum)
 Robin Burke - 43 y.o. female MRN [id number]  Date of birth: 1981/03/18  Office Visit Note: Visit Date: 05/25/2024 PCP: de [place], Raymond J, MD Referred by: Emiliano Leonce CROME, PA*  Medical Resident, Sports Medicine Fellow - Attending Physician Addendum:   I have independently interviewed and examined the patient myself. I have discussed the above with the original author and agree with their documentation. My edits for correction/addition/clarification have been made, see any changes above and below.   In summary, 43 year-old female with chronic left heel pain, pain with first steps in AM and symptoms indicative of plantar fasciitis.  We did proceed with her first trial of extracorporeal shockwave therapy, would like to bring her back to repeat an additional 2 treatments and further reevaluation.  Did provide recommendation for over-the-counter orthotics to help support her longitudinal arch.  Customize handout for home exercise regimen including stretches and plantar fascia strengthening was provided and demonstrated today.  She will perform these once daily.  Discussed importance of supportive shoe wear.  Follow-up at the next 1-2 weeks.  Lonell Sprang, DO Primary Care Sports Medicine Physician  [facility]   Subjective: Chief Complaint  Patient presents with   Left Foot - Pain   HPI: Robin Burke is a pleasant 43 y.o. female who presents today for evaluation of left plantar fascia pain.  Patient had seen my colleague Leonce at [facility] on 05/05/2024 for original diagnosis.  Was referred to us  for further evaluation and treatment.  Patient states initial pain began after walking 40,000 steps in one day during [place] [employer]. The pain is most severe in the morning, described as 'unbearable' upon getting out of bed. She manages the pain by rolling her foot on an ice bottle and performing stretches.  She has also tried some  over-the-counter arthritis creams and bought some new [place] running shoes which seem to help somewhat.  She is also wearing Oofos for recovery which improves pain at end of day.  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  Describes majority of pain along medial longitudinal arch, just distal to calcaneal insertion.  Oral medication has not been helpful and she does not want to take any medication daily.  Interested in shockwave therapy today.  Pertinent ROS were reviewed with the patient and found to be negative unless otherwise specified above in HPI.   Assessment & Plan: Visit Diagnoses:  1. Plantar fasciitis of left foot   2. Chronic heel pain, left    Plan: History and physical exam consistent with diagnosis of left plantar fasciitis.  Will use multimodal approach for treatment.  Patient underwent first session of extracorporal shockwave therapy today and tolerated procedure well.  Will plan for additional 2 sessions and then reassess progress at that point.  Also recommended patient by [facility] plantar fasciitis orthotics to support medial longitudinal arch of foot.  Provided patient with home exercise program to strengthen plantar fascia and surrounding musculature.  ATC demonstrated home exercise program at appointment today.  Can continue using topical analgesics as needed.  Discussed [place] sock, cortisone injections, custom orthotics and debridement other treatment options during visit.  Will defer using these modalities unless unsuccessful with initial treatment recommendation is listed above.  Follow-up: Return in about 1-2 weeks  Meds & Orders: No orders of the defined types were placed in this encounter.  No orders of the defined types were placed in this encounter.  Procedures: Procedure: ECSWT Indications:  left plantar fasciitis   Procedure Details Consent: Risks of procedure as well as the alternatives and risks of each were explained to the  patient.  Written consent for procedure obtained. Time Out: Verified patient identification, verified procedure, site was marked, verified correct patient position, medications/allergies/relevent history reviewed.  The area was cleaned with alcohol swab.     The left plantar fascia was targeted for Extracorporeal shockwave therapy.    Preset: Plantar Fasciitis Power Level: 90 mJ Frequency: 10-12 HZ Impulse/cycles: 3000 Head size: Large   Patient tolerated procedure well without immediate complications   Clinical History: No specialty comments available.  She reports that she quit smoking about 12 years ago. Her smoking use included cigarettes. She started smoking about 22 years ago. She has a 2.5 pack-year smoking history. She has been exposed to tobacco smoke. She has never used smokeless tobacco.  Recent Labs    05/05/24 1423  HGBA1C 5.5    Objective:   Vital Signs: LMP 04/19/2024 (Approximate)   Physical Exam  Gen: Well-appearing, in no acute distress; non-toxic CV: Well-perfused. Warm.  Resp: Breathing unlabored on room air; no wheezing. Psych: Fluid speech in conversation; appropriate affect; normal thought process  Ortho Exam - On inspection no evidence of erythema, ecchymosis, or edema present surrounding plantar surface of left foot.  Patient is tender to palpation over medial calcaneus and along the proximal medial longitudinal arch of foot.  Patient has full active/passive pain-free range of motion of her foot and ankle.  Patient is limited in dorsiflexion of her foot and has tightness of calf and Achilles musculature.  Strength 5/5 bilaterally.  Neurovascularly intact distally.  Calcaneal squeeze test negative.  Thump test negative.  Windlass negative.  Imaging:  *I did review x-ray imaging today during the visit.  Narrative & Impression  EXAM: 3 OR MORE VIEW(S) XRAY OF THE LEFT FOOT 05/05/2024 11:15:50 AM   COMPARISON: None available.   CLINICAL  HISTORY: pain   FINDINGS:   BONES AND JOINTS: No acute fracture. No focal osseous lesion. No joint dislocation.   SOFT TISSUES: The soft tissues are unremarkable.   IMPRESSION: 1. No acute abnormalities.   Electronically signed by: Norman Gatlin MD 05/11/2024 02:33 AM EDT RP Workstation: [id number]      Past Medical/Family/Surgical/Social History: Medications & Allergies reviewed per EMR, new medications updated. Patient Active Problem List   Diagnosis Date Noted   Sleep disturbance 01/22/2024   Pain in left shoulder 12/03/2023   Neck pain 09/17/2023   Depression 09/16/2023   Prediabetes 09/16/2023   Bilateral hip pain 07/30/2021   Insomnia 10/24/2020   BMI 34.0-34.9,adult 10/24/2020   ADHD 10/24/2020   Vitamin D  deficiency 10/28/2016   Fatigue 12/31/2013   Thyroid  nodule 12/31/2013   Wellness examination 05/13/2012   Past Medical History:  Diagnosis Date   Anxiety    Depression Spring 2021   HPV in female 2006   w/cryo, normal since   STD (female) 2010   HSV I & II  by serology   Thyroid  nodule    Family History  Problem Relation Age of Onset   Thyroid  cancer Maternal Grandmother    Osteoarthritis Maternal Grandmother    Dementia Maternal Grandmother    Cancer Maternal Grandmother    Depression Maternal Grandmother    Diabetes Maternal Grandmother    Stroke Maternal Grandmother    Coronary artery disease Father        Had 1 stent placed   ADD /  ADHD Father    Heart disease Father    Diabetes Mother    Hyperlipidemia Mother    Cancer Mother    Depression Mother    Obesity Mother    Diabetes Brother    Cancer Maternal Aunt    Past Surgical History:  Procedure Laterality Date   CHOLECYSTECTOMY N/A 01/22/2017   Procedure: LAPAROSCOPIC CHOLECYSTECTOMY;  Surgeon: Rubin Calamity, MD;  Location: [facility] OR;  Service: General;  Laterality: N/A;   laprosopic myomectomy  05/11/2021   thyroid  removed     WISDOM TOOTH EXTRACTION  08/12/2005   Social  History   Occupational History   Occupation: Event planner    Comment: [employer]  Tobacco Use   Smoking status: Former    Current packs/day: 0.00    Average packs/day: 0.3 packs/day for 10.0 years (2.5 ttl pk-yrs)    Types: Cigarettes    Start date: 09/12/2001    Quit date: 09/13/2011    Years since quitting: 12.7    Passive exposure: Past   Smokeless tobacco: Never  Vaping Use   Vaping status: Never Used  Substance and Sexual Activity   Alcohol use: Yes    Alcohol/week: 3.0 standard drinks of alcohol    Types: 3 Standard drinks or equivalent per week   Drug use: No   Sexual activity: Not Currently    Partners: Male    Birth control/protection: None

## 2024-06-01 ENCOUNTER — Encounter: Payer: Self-pay | Admitting: Sports Medicine

## 2024-06-01 ENCOUNTER — Ambulatory Visit: Admitting: Sports Medicine

## 2024-06-01 DIAGNOSIS — M722 Plantar fascial fibromatosis: Secondary | ICD-10-CM | POA: Diagnosis not present

## 2024-06-01 DIAGNOSIS — M79672 Pain in left foot: Secondary | ICD-10-CM

## 2024-06-01 DIAGNOSIS — G8929 Other chronic pain: Secondary | ICD-10-CM | POA: Diagnosis not present

## 2024-06-01 LAB — SPECIMEN STATUS REPORT

## 2024-06-01 LAB — VITAMIN D 25 HYDROXY (VIT D DEFICIENCY, FRACTURES): Vit D, 25-Hydroxy: 21.6 ng/mL — ABNORMAL LOW (ref 30.0–100.0)

## 2024-06-01 NOTE — Progress Notes (Signed)
 Robin Burke - 43 y.o. female MRN [id number]  Date of birth: [birth year]  Office Visit Note: Visit Date: 06/01/2024 PCP: de Burke, Robin J, MD Referred by: de Burke, Robin J, MD  Subjective: Chief Complaint  Patient presents with   Left Foot - Follow-up   HPI: Robin Burke is a pleasant 43 y.o. female who presents today for left foot plantar fascia pain.  She tolerated first treatment of extracorporeal shockwave therapy well back last week.  Had been doing fine but did do a lot of walking Friday and Saturday and so her pain was quite significant on Sunday.  She is not taking any specific medications.  She has been doing her home exercises and stretching which does feel good.  She is trialing different shoewear.  Has not yet found over-the-counter orthotics but will check her MyChart with my messaging regarding recommendations.  Pertinent ROS were reviewed with the patient and found to be negative unless otherwise specified above in HPI.   Assessment & Plan: Visit Diagnoses:  1. Plantar fasciitis of left foot   2. Chronic heel pain, left    Plan: Impression is plantar fascia syndrome of the left foot with functional longitudinal arch loss.  We did repeat extracorporeal shockwave therapy today, patient tolerated well.  Did discuss over-the-counter orthotics to help support the arch of her foot and underlying plantar fascia, patient will order these at home based on recommendations provided in MyChart.  She is fine for over-the-counter medications only as needed.  She will continue her home exercises to work on stretching of the posterior cord and strengthening of the underlying plantar fascia.  We will plan for 1 additional shockwave treatment and further evaluation in about 1 week and then discuss further treatments or other modalities based on her cumulative benefit going forward.  Follow-up: 1-week   Meds & Orders: No orders of the defined types were placed in this  encounter.  No orders of the defined types were placed in this encounter.    Procedures: Procedure: ECSWT Indications:  left plantar fasciitis   Procedure Details Consent: Risks of procedure as well as the alternatives and risks of each were explained to the patient.  Written consent for procedure obtained. Time Out: Verified patient identification, verified procedure, site was marked, verified correct patient position, medications/allergies/relevent history reviewed.  The area was cleaned with alcohol swab.     The left plantar fascia was targeted for Extracorporeal shockwave therapy.    Preset: Plantar Fasciitis Power Level: 100 mJ Frequency: 12 HZ Impulse/cycles: 2600 Head size: Regular  *Did perform some for the contralateral right plantar fascia today as well   Patient tolerated procedure well without immediate complications      Clinical History: No specialty comments available.  She reports that she quit smoking about 12 years ago. Her smoking use included cigarettes. She started smoking about 22 years ago. She has a 2.5 pack-year smoking history. She has been exposed to tobacco smoke. She has never used smokeless tobacco.  Recent Labs    05/05/24 1423  HGBA1C 5.5    Objective:    Physical Exam  Gen: Well-appearing, in no acute distress; non-toxic CV: Well-perfused. Warm.  Resp: Breathing unlabored on room air; no wheezing. Psych: Fluid speech in conversation; appropriate affect; normal thought process  Ortho Exam - Left foot: No redness swelling or effusion.  There is tenderness over the medial band of the plantar fascia as it extends into the longitudinal arch.  There is a degree  of functional pes planus upon standing.  Imaging: No results found.  Past Medical/Family/Surgical/Social History: Medications & Allergies reviewed per EMR, new medications updated. Patient Active Problem List   Diagnosis Date Noted   Sleep disturbance 01/22/2024   Pain in left  shoulder 12/03/2023   Neck pain 09/17/2023   Depression 09/16/2023   Prediabetes 09/16/2023   Bilateral hip pain 07/30/2021   Insomnia 10/24/2020   BMI 34.0-34.9,adult 10/24/2020   ADHD 10/24/2020   Vitamin D  deficiency 10/28/2016   Fatigue 12/31/2013   Thyroid  nodule 12/31/2013   Wellness examination 05/13/2012   Past Medical History:  Diagnosis Date   Anxiety    Depression Spring 2021   HPV in female 2006   w/cryo, normal since   STD (female) 2010   HSV I & II  by serology   Thyroid  nodule    Family History  Problem Relation Age of Onset   Thyroid  cancer Maternal Grandmother    Osteoarthritis Maternal Grandmother    Dementia Maternal Grandmother    Cancer Maternal Grandmother    Depression Maternal Grandmother    Diabetes Maternal Grandmother    Stroke Maternal Grandmother    Coronary artery disease Father        Had 1 stent placed   ADD / ADHD Father    Heart disease Father    Diabetes Mother    Hyperlipidemia Mother    Cancer Mother    Depression Mother    Obesity Mother    Diabetes Brother    Cancer Maternal Aunt    Past Surgical History:  Procedure Laterality Date   CHOLECYSTECTOMY N/A 01/22/2017   Procedure: LAPAROSCOPIC CHOLECYSTECTOMY;  Surgeon: Rubin Calamity, MD;  Location: [facility] OR;  Service: General;  Laterality: N/A;   laprosopic myomectomy  05/11/2021   thyroid  removed     WISDOM TOOTH EXTRACTION  08/12/2005   Social History   Occupational History   Occupation: Event planner    Comment: [employer]  Tobacco Use   Smoking status: Former    Current packs/day: 0.00    Average packs/day: 0.3 packs/day for 10.0 years (2.5 ttl pk-yrs)    Types: Cigarettes    Start date: 09/12/2001    Quit date: 09/13/2011    Years since quitting: 12.7    Passive exposure: Past   Smokeless tobacco: Never  Vaping Use   Vaping status: Never Used  Substance and Sexual Activity   Alcohol use: Yes    Alcohol/week: 3.0 standard drinks of alcohol    Types: 3 Standard  drinks or equivalent per week   Drug use: No   Sexual activity: Not Currently    Partners: Male    Birth control/protection: None

## 2024-06-01 NOTE — Progress Notes (Signed)
 Patient says that she has been feeling pretty good since her last appointment. She did have an 8-9/10 pain on Sunday after being on her feet a lot on Friday and Saturday; this pain lasted the day and improved on its own. She says that she is having no sharp pain in the foot today. Patient denies any significant soreness or tenderness after the first shockwave appointment. She has been doing her exercises consistently, which she says do feel good.

## 2024-06-03 ENCOUNTER — Other Ambulatory Visit: Payer: Self-pay | Admitting: Endocrinology

## 2024-06-03 DIAGNOSIS — E041 Nontoxic single thyroid nodule: Secondary | ICD-10-CM

## 2024-06-07 ENCOUNTER — Ambulatory Visit: Admitting: Sports Medicine

## 2024-06-07 ENCOUNTER — Encounter: Payer: Self-pay | Admitting: Sports Medicine

## 2024-06-07 DIAGNOSIS — M79672 Pain in left foot: Secondary | ICD-10-CM

## 2024-06-07 DIAGNOSIS — G8929 Other chronic pain: Secondary | ICD-10-CM | POA: Diagnosis not present

## 2024-06-07 DIAGNOSIS — M722 Plantar fascial fibromatosis: Secondary | ICD-10-CM

## 2024-06-07 NOTE — Progress Notes (Signed)
 Patient says that she has not had any 8-9/10 pain, which is an improvement. She has still had consistent soreness, and occasional 5-6/10 pain, although she does not notice a pattern with these flare ups of pain. She has not gotten her orthotics yet as there was some confusion with the order, but she has purchased them.

## 2024-06-07 NOTE — Progress Notes (Signed)
 Robin Burke - 43 y.o. female MRN [id number]  Date of birth: 07/05/1981  Office Visit Note: Visit Date: 06/07/2024 PCP: de [place], Raymond J, MD Referred by: de [place], Raymond J, MD  Subjective: Chief Complaint  Patient presents with   Left Foot - Follow-up   HPI: Robin Burke is a pleasant 43 y.o. female who presents today for follow-up of  left foot plantar fascia pain.  We have done 2 treatments of extracorporeal shockwave therapy, she has responded well to both of these.  Her severe pain has certainly diminished from an 8/9 out of 10 down to a 5-6/10.  She did purchase her orthotics and is planning to pick them up today for her shoes.  Overall she feels she is about 30% improved.  She does continue her home stretching and exercises.  Pertinent ROS were reviewed with the patient and found to be negative unless otherwise specified above in HPI.   Assessment & Plan: Visit Diagnoses:  1. Plantar fasciitis of left foot   2. Chronic heel pain, left    Plan: Impression is chronic left heel pain with evidence of plantar fasciitis.  She has received about 30% improvement from previous extracorporeal shockwave therapy, we did repeat an additional treatment today, patient tolerated well.  She did or the [employer] orthotics, she will pick these up today and start wearing these in her shoes to help offload the plantar fascia and support the longitudinal arch.  She will continue her home rehab exercises on a consistent basis.  As long as she continues to improve we will consider an additional few treatments of shockwave therapy.  Did discuss the role for injection therapy but both [name] and I are agreeable to hold off on this unless she plateaus with the above treatment.  Follow-up of the next 1-2 weeks for repeat shockwave and further evaluation.  She will bring in her orthotics so I can watch her ambulate and see how this fits her foot shape.  Follow-up: Return in about 11 days (around  06/18/2024) for schedule 2 appts about 1-week apart (L-PF).   Meds & Orders: No orders of the defined types were placed in this encounter.  No orders of the defined types were placed in this encounter.    Procedures:  Procedure: ECSWT Indications:  left plantar fasciitis   Procedure Details Consent: Risks of procedure as well as the alternatives and risks of each were explained to the patient.  Written consent for procedure obtained. Time Out: Verified patient identification, verified procedure, site was marked, verified correct patient position, medications/allergies/relevent history reviewed.  The area was cleaned with alcohol swab.     The left plantar fascia was targeted for Extracorporeal shockwave therapy.    Preset: Plantar Fasciitis Power Level: 100-110 mJ Frequency: 12 HZ Impulse/cycles: 2750 Head size: Regular  Patient tolerated procedure well without immediate complications       Clinical History: No specialty comments available.  She reports that she quit smoking about 12 years ago. Her smoking use included cigarettes. She started smoking about 22 years ago. She has a 2.5 pack-year smoking history. She has been exposed to tobacco smoke. She has never used smokeless tobacco.  Recent Labs    05/05/24 1423  HGBA1C 5.5    Objective:    Physical Exam  Gen: Well-appearing, in no acute distress; non-toxic CV: Well-perfused. Warm.  Resp: Breathing unlabored on room air; no wheezing. Psych: Fluid speech in conversation; appropriate affect; normal thought process  Ortho Exam -  Left foot/ankle: Mild TTP over the medial > lateral band of the plantar fascia.  No redness swelling or effusion.  Negative calcaneal heel squeeze.  Imaging: No results found.  Past Medical/Family/Surgical/Social History: Medications & Allergies reviewed per EMR, new medications updated. Patient Active Problem List   Diagnosis Date Noted   Sleep disturbance 01/22/2024   Pain in left  shoulder 12/03/2023   Neck pain 09/17/2023   Depression 09/16/2023   Prediabetes 09/16/2023   Bilateral hip pain 07/30/2021   Insomnia 10/24/2020   BMI 34.0-34.9,adult 10/24/2020   ADHD 10/24/2020   Vitamin D  deficiency 10/28/2016   Fatigue 12/31/2013   Thyroid  nodule 12/31/2013   Wellness examination 05/13/2012   Past Medical History:  Diagnosis Date   Anxiety    Depression Spring 2021   HPV in female 2006   w/cryo, normal since   STD (female) 2010   HSV I & II  by serology   Thyroid  nodule    Family History  Problem Relation Age of Onset   Thyroid  cancer Maternal Grandmother    Osteoarthritis Maternal Grandmother    Dementia Maternal Grandmother    Cancer Maternal Grandmother    Depression Maternal Grandmother    Diabetes Maternal Grandmother    Stroke Maternal Grandmother    Coronary artery disease Father        Had 1 stent placed   ADD / ADHD Father    Heart disease Father    Diabetes Mother    Hyperlipidemia Mother    Cancer Mother    Depression Mother    Obesity Mother    Diabetes Brother    Cancer Maternal Aunt    Past Surgical History:  Procedure Laterality Date   CHOLECYSTECTOMY N/A 01/22/2017   Procedure: LAPAROSCOPIC CHOLECYSTECTOMY;  Surgeon: Rubin Calamity, MD;  Location: [facility] OR;  Service: General;  Laterality: N/A;   laprosopic myomectomy  05/11/2021   thyroid  removed     WISDOM TOOTH EXTRACTION  08/12/2005   Social History   Occupational History   Occupation: Event planner    Comment: [employer]  Tobacco Use   Smoking status: Former    Current packs/day: 0.00    Average packs/day: 0.3 packs/day for 10.0 years (2.5 ttl pk-yrs)    Types: Cigarettes    Start date: 09/12/2001    Quit date: 09/13/2011    Years since quitting: 12.7    Passive exposure: Past   Smokeless tobacco: Never  Vaping Use   Vaping status: Never Used  Substance and Sexual Activity   Alcohol use: Yes    Alcohol/week: 3.0 standard drinks of alcohol    Types: 3 Standard  drinks or equivalent per week   Drug use: No   Sexual activity: Not Currently    Partners: Male    Birth control/protection: None

## 2024-06-14 ENCOUNTER — Encounter: Payer: Self-pay | Admitting: Radiology

## 2024-06-15 ENCOUNTER — Ambulatory Visit
Admission: RE | Admit: 2024-06-15 | Discharge: 2024-06-15 | Disposition: A | Discharge status: Home / Self Care | Source: Ambulatory Visit | Attending: Endocrinology | Admitting: Endocrinology

## 2024-06-15 DIAGNOSIS — E042 Nontoxic multinodular goiter: Secondary | ICD-10-CM | POA: Diagnosis not present

## 2024-06-15 DIAGNOSIS — E041 Nontoxic single thyroid nodule: Secondary | ICD-10-CM

## 2024-06-21 ENCOUNTER — Ambulatory Visit: Admitting: Sports Medicine

## 2024-06-22 ENCOUNTER — Ambulatory Visit: Admitting: Sports Medicine

## 2024-06-22 ENCOUNTER — Encounter: Payer: Self-pay | Admitting: Sports Medicine

## 2024-06-22 DIAGNOSIS — M2142 Flat foot [pes planus] (acquired), left foot: Secondary | ICD-10-CM | POA: Diagnosis not present

## 2024-06-22 DIAGNOSIS — M79672 Pain in left foot: Secondary | ICD-10-CM | POA: Diagnosis not present

## 2024-06-22 DIAGNOSIS — G8929 Other chronic pain: Secondary | ICD-10-CM

## 2024-06-22 DIAGNOSIS — E063 Autoimmune thyroiditis: Secondary | ICD-10-CM | POA: Diagnosis not present

## 2024-06-22 DIAGNOSIS — M2141 Flat foot [pes planus] (acquired), right foot: Secondary | ICD-10-CM

## 2024-06-22 DIAGNOSIS — E559 Vitamin D deficiency, unspecified: Secondary | ICD-10-CM | POA: Diagnosis not present

## 2024-06-22 DIAGNOSIS — E041 Nontoxic single thyroid nodule: Secondary | ICD-10-CM | POA: Diagnosis not present

## 2024-06-22 DIAGNOSIS — M722 Plantar fascial fibromatosis: Secondary | ICD-10-CM

## 2024-06-22 DIAGNOSIS — E669 Obesity, unspecified: Secondary | ICD-10-CM | POA: Diagnosis not present

## 2024-06-22 DIAGNOSIS — R7303 Prediabetes: Secondary | ICD-10-CM | POA: Diagnosis not present

## 2024-06-22 NOTE — Progress Notes (Signed)
 Robin Burke - 43 y.o. female MRN [id number]  Date of birth: [birth year]  Office Visit Note: Visit Date: 06/22/2024 PCP: de Cuba, Raymond J, MD Referred by: de [place], Raymond J, MD  Subjective: Chief Complaint  Patient presents with   Left Foot - Follow-up   HPI: Robin Burke is a pleasant 43 y.o. female who presents today for follow-up of left plantar foot pain/plantar fasciitis.  In total, we have performed 3 treatments of extracorporeal shockwave therapy for her plantar fascia.  She has felt like she is greater than 50% improved from this.  Unfortunately over the past week she did have a 3-day event in [place] where she was on her feet constantly and following this she had exacerbation of her pain.  She also covered a robotics tournament so has been working and on her feet quite often recently.  Not taking any medication for this other than topical.  She has not yet received her orthotic insoles but these are arriving on Thursday.  He would like to avoid medication and injection therapy at this time.  Pertinent ROS were reviewed with the patient and found to be negative unless otherwise specified above in HPI.   Assessment & Plan: Visit Diagnoses:  1. Plantar fasciitis of left foot   2. Chronic heel pain, left   3. Pes planus of both feet    Plan: Pression is overall improving plantar fasciitis in the setting of pes planus, although has had a mild setback given her 3-day spent in [place] where she was on her feet constantly.  We discussed treatment options such as continuing a few treatments of extracorporeal shockwave therapy, oral anti-inflammatories, injection therapy.  Given her improvement and minimal side effect profile, she would like to continue shockwave therapy, this was repeated today.  Starting tomorrow, she will continue her home exercises for stretching and strengthening of the underlying plantar fascia and posterior cord.  She will advance into her orthotic insoles  to help offload the plantar fascia and support her pes planus.  I will see her back next week to evaluate her in the knees and plan on repeating additional shockwave therapy.  Meds & Orders: No orders of the defined types were placed in this encounter.  No orders of the defined types were placed in this encounter.    Procedures: Procedure: ECSWT Indications:  left plantar fasciitis   Procedure Details Consent: Risks of procedure as well as the alternatives and risks of each were explained to the patient.  Written consent for procedure obtained. Time Out: Verified patient identification, verified procedure, site was marked, verified correct patient position, medications/allergies/relevent history reviewed.  The area was cleaned with alcohol swab.     The left plantar fascia was targeted for Extracorporeal shockwave therapy.    Preset: Plantar Fasciitis Power Level: 110 mJ Frequency: 12 HZ Impulse/cycles: 2600 Head size: Regular   Patient tolerated procedure well without immediate complications        Clinical History: No specialty comments available.  She reports that she quit smoking about 12 years ago. Her smoking use included cigarettes. She started smoking about 22 years ago. She has a 2.5 pack-year smoking history. She has been exposed to tobacco smoke. She has never used smokeless tobacco.  Recent Labs    05/05/24 1423  HGBA1C 5.5    Objective:    Physical Exam  Gen: Well-appearing, in no acute distress; non-toxic CV: Well-perfused. Warm.  Resp: Breathing unlabored on room air; no wheezing. Psych: Fluid speech  in conversation; appropriate affect; normal thought process  Ortho Exam - Left foot: + TTP over the medial band of the plantar fascia, which is just distal to the insertion over the plantar calcaneus.  There is functional pes planus with standing.  Imaging: No results found.  Past Medical/Family/Surgical/Social History: Medications & Allergies reviewed per  EMR, new medications updated. Patient Active Problem List   Diagnosis Date Noted   Sleep disturbance 01/22/2024   Pain in left shoulder 12/03/2023   Neck pain 09/17/2023   Depression 09/16/2023   Prediabetes 09/16/2023   Bilateral hip pain 07/30/2021   Insomnia 10/24/2020   BMI 34.0-34.9,adult 10/24/2020   ADHD 10/24/2020   Vitamin D  deficiency 10/28/2016   Fatigue 12/31/2013   Thyroid  nodule 12/31/2013   Wellness examination 05/13/2012   Past Medical History:  Diagnosis Date   Anxiety    Depression Spring 2021   HPV in female 2006   w/cryo, normal since   STD (female) 2010   HSV I & II  by serology   Thyroid  nodule    Family History  Problem Relation Age of Onset   Thyroid  cancer Maternal Grandmother    Osteoarthritis Maternal Grandmother    Dementia Maternal Grandmother    Cancer Maternal Grandmother    Depression Maternal Grandmother    Diabetes Maternal Grandmother    Stroke Maternal Grandmother    Coronary artery disease Father        Had 1 stent placed   ADD / ADHD Father    Heart disease Father    Diabetes Mother    Hyperlipidemia Mother    Cancer Mother    Depression Mother    Obesity Mother    Diabetes Brother    Cancer Maternal Aunt    Past Surgical History:  Procedure Laterality Date   CHOLECYSTECTOMY N/A 01/22/2017   Procedure: LAPAROSCOPIC CHOLECYSTECTOMY;  Surgeon: Rubin Calamity, MD;  Location: [facility] OR;  Service: General;  Laterality: N/A;   laprosopic myomectomy  05/11/2021   thyroid  removed     WISDOM TOOTH EXTRACTION  08/12/2005   Social History   Occupational History   Occupation: Event planner    Comment: [employer]  Tobacco Use   Smoking status: Former    Current packs/day: 0.00    Average packs/day: 0.3 packs/day for 10.0 years (2.5 ttl pk-yrs)    Types: Cigarettes    Start date: 09/12/2001    Quit date: 09/13/2011    Years since quitting: 12.7    Passive exposure: Past   Smokeless tobacco: Never  Vaping Use   Vaping status:  Never Used  Substance and Sexual Activity   Alcohol use: Yes    Alcohol/week: 3.0 standard drinks of alcohol    Types: 3 Standard drinks or equivalent per week   Drug use: No   Sexual activity: Not Currently    Partners: Male    Birth control/protection: None

## 2024-06-22 NOTE — Progress Notes (Signed)
 Patient says that she is feeling sore and having sharp pains throughout the day. She did work a 3-day event in [place] where she was on her feet constantly, and believes that could be contributing to her soreness and sharp pain. She has not gotten her insoles. She is here today for repeat shockwave therapy.

## 2024-06-28 ENCOUNTER — Ambulatory Visit: Admitting: Sports Medicine

## 2024-06-28 ENCOUNTER — Encounter: Payer: Self-pay | Admitting: Sports Medicine

## 2024-06-28 DIAGNOSIS — M2141 Flat foot [pes planus] (acquired), right foot: Secondary | ICD-10-CM | POA: Diagnosis not present

## 2024-06-28 DIAGNOSIS — M79672 Pain in left foot: Secondary | ICD-10-CM

## 2024-06-28 DIAGNOSIS — M722 Plantar fascial fibromatosis: Secondary | ICD-10-CM

## 2024-06-28 DIAGNOSIS — G8929 Other chronic pain: Secondary | ICD-10-CM

## 2024-06-28 DIAGNOSIS — M2142 Flat foot [pes planus] (acquired), left foot: Secondary | ICD-10-CM

## 2024-06-28 NOTE — Progress Notes (Signed)
 Patient says that she feels her foot is worse, both intensity and consistency. She says that she worked a lot last week, and her foot seemed to become worse over the last three days. She did start wearing the new insoles in her boots on Friday, which was around the time that her pain increased.

## 2024-06-28 NOTE — Progress Notes (Signed)
 [name] - 43 y.o. female MRN [id number]  Date of birth: 02-10-1981  Office Visit Note: Visit Date: 06/28/2024 PCP: de Cuba, Raymond J, MD Referred by: de Cuba, Raymond J, MD  Subjective: Chief Complaint  Patient presents with   Left Foot - Follow-up   HPI: Robin Burke is a pleasant 43 y.o. female who presents today for follow-up of chronic left foot/plantar foot pain.  Overall she still feels the foot/heel is doing better, but she has been walking and on her feet more both intensity and duration here recently.  She did get her orthotic insoles and has been wearing these over the last couple days.  She does feel she is adjusting to these as she has had some more pain around that time.  Because of how busy she has been, she admits she has not been doing her home exercises as much recently.  Still having pain with first steps in the morning.  She is asking about a nighttime splint.  Pertinent ROS were reviewed with the patient and found to be negative unless otherwise specified above in HPI.   Assessment & Plan: Visit Diagnoses:  1. Plantar fasciitis of left foot   2. Chronic heel pain, left   3. Pes planus of both feet    Plan: Impression is plantar fasciitis of the left foot in the setting of pes planus.  She has been improving with conservative treatments including extracorporeal shockwave therapy, has not been as consistent with her home exercises and stretching.  I did provide her a new handout sheet, as she had only been doing 2 of the stretches.  She was appreciative of this, we did review other strengthening exercises on the handout for her to perform at least once to twice daily on a consistent regimen.  We did repeat extracorporeal shockwave therapy, tolerated well.  I would like her to continue in her orthotic insoles, avoid barefoot walking, and be consistent with her HEP for the next 4 to 6 weeks.  Will then update me how she is doing at that time.  We did review  [place] sock, this is something she may purchase over-the-counter and use at nighttime to help stretch the plantar fascia when sleeping.  Okay for over-the-counter anti-inflammatories only as needed.  Follow-up: Return for Update me via Mychart 4-6 weeks.   Meds & Orders: No orders of the defined types were placed in this encounter.  No orders of the defined types were placed in this encounter.    Procedures: Procedure: ECSWT Indications:  left plantar fasciitis   Procedure Details Consent: Risks of procedure as well as the alternatives and risks of each were explained to the patient.  Written consent for procedure obtained. Time Out: Verified patient identification, verified procedure, site was marked, verified correct patient position, medications/allergies/relevent history reviewed.  The area was cleaned with alcohol swab.     The left plantar fascia was targeted for Extracorporeal shockwave therapy.    Preset: Plantar Fasciitis Power Level: 110 mJ Frequency: 12 HZ Impulse/cycles: 2250 Head size: Regular   Patient tolerated procedure well without immediate complications      Clinical History: No specialty comments available.  She reports that she quit smoking about 12 years ago. Her smoking use included cigarettes. She started smoking about 22 years ago. She has a 2.5 pack-year smoking history. She has been exposed to tobacco smoke. She has never used smokeless tobacco.  Recent Labs    05/05/24 1423  HGBA1C 5.5  Objective:    Physical Exam  Gen: Well-appearing, in no acute distress; non-toxic CV: Well-perfused. Warm.  Resp: Breathing unlabored on room air; no wheezing. Psych: Fluid speech in conversation; appropriate affect; normal thought process  Ortho Exam - Bilateral feet: Pes planus noted bilaterally.  There is mild tenderness over the plantar aspect of the medial band just distal to the calcaneus.  Imaging: No results found.  Past  Medical/Family/Surgical/Social History: Medications & Allergies reviewed per EMR, new medications updated. Patient Active Problem List   Diagnosis Date Noted   Sleep disturbance 01/22/2024   Pain in left shoulder 12/03/2023   Neck pain 09/17/2023   Depression 09/16/2023   Prediabetes 09/16/2023   Bilateral hip pain 07/30/2021   Insomnia 10/24/2020   BMI 34.0-34.9,adult 10/24/2020   ADHD 10/24/2020   Vitamin D  deficiency 10/28/2016   Fatigue 12/31/2013   Thyroid  nodule 12/31/2013   Wellness examination 05/13/2012   Past Medical History:  Diagnosis Date   Anxiety    Depression Spring 2021   HPV in female 2006   w/cryo, normal since   STD (female) 2010   HSV I & II  by serology   Thyroid  nodule    Family History  Problem Relation Age of Onset   Thyroid  cancer Maternal Grandmother    Osteoarthritis Maternal Grandmother    Dementia Maternal Grandmother    Cancer Maternal Grandmother    Depression Maternal Grandmother    Diabetes Maternal Grandmother    Stroke Maternal Grandmother    Coronary artery disease Father        Had 1 stent placed   ADD / ADHD Father    Heart disease Father    Diabetes Mother    Hyperlipidemia Mother    Cancer Mother    Depression Mother    Obesity Mother    Diabetes Brother    Cancer Maternal Aunt    Past Surgical History:  Procedure Laterality Date   CHOLECYSTECTOMY N/A 01/22/2017   Procedure: LAPAROSCOPIC CHOLECYSTECTOMY;  Surgeon: Rubin Calamity, MD;  Location: [facility] OR;  Service: General;  Laterality: N/A;   laprosopic myomectomy  05/11/2021   thyroid  removed     WISDOM TOOTH EXTRACTION  08/12/2005   Social History   Occupational History   Occupation: Event planner    Comment: [employer]  Tobacco Use   Smoking status: Former    Current packs/day: 0.00    Average packs/day: 0.3 packs/day for 10.0 years (2.5 ttl pk-yrs)    Types: Cigarettes    Start date: 09/12/2001    Quit date: 09/13/2011    Years since quitting: 12.8    Passive  exposure: Past   Smokeless tobacco: Never  Vaping Use   Vaping status: Never Used  Substance and Sexual Activity   Alcohol use: Yes    Alcohol/week: 3.0 standard drinks of alcohol    Types: 3 Standard drinks or equivalent per week   Drug use: No   Sexual activity: Not Currently    Partners: Male    Birth control/protection: None

## 2024-07-21 ENCOUNTER — Ambulatory Visit (HOSPITAL_BASED_OUTPATIENT_CLINIC_OR_DEPARTMENT_OTHER): Admitting: Family Medicine

## 2024-07-28 ENCOUNTER — Ambulatory Visit (HOSPITAL_BASED_OUTPATIENT_CLINIC_OR_DEPARTMENT_OTHER): Admitting: Family Medicine

## 2024-08-17 ENCOUNTER — Ambulatory Visit (INDEPENDENT_AMBULATORY_CARE_PROVIDER_SITE_OTHER): Admitting: Family Medicine

## 2024-08-17 ENCOUNTER — Encounter (HOSPITAL_BASED_OUTPATIENT_CLINIC_OR_DEPARTMENT_OTHER): Payer: Self-pay | Admitting: Family Medicine

## 2024-08-17 VITALS — BP 119/59 | HR 66 | Temp 98.0°F | Resp 20 | Ht 61.0 in | Wt 179.0 lb

## 2024-08-17 DIAGNOSIS — F909 Attention-deficit hyperactivity disorder, unspecified type: Secondary | ICD-10-CM | POA: Diagnosis not present

## 2024-08-17 DIAGNOSIS — J029 Acute pharyngitis, unspecified: Secondary | ICD-10-CM | POA: Diagnosis not present

## 2024-08-17 DIAGNOSIS — J069 Acute upper respiratory infection, unspecified: Secondary | ICD-10-CM | POA: Diagnosis not present

## 2024-08-17 LAB — POC COVID19/FLU A&B COMBO
Covid Antigen, POC: NEGATIVE
Influenza A Antigen, POC: NEGATIVE
Influenza B Antigen, POC: NEGATIVE

## 2024-08-17 LAB — POCT RAPID STREP A (OFFICE): Rapid Strep A Screen: NEGATIVE

## 2024-08-17 MED ORDER — BENZONATATE 200 MG PO CAPS: 200.0000 mg | ORAL_CAPSULE | Freq: Three times a day (TID) | ORAL | 0 refills | Status: AC | PRN

## 2024-08-17 MED ORDER — FLUTICASONE PROPIONATE 50 MCG/ACT NA SUSP: 2.0000 | Freq: Every day | NASAL | 1 refills | Status: DC

## 2024-08-17 MED ORDER — LISDEXAMFETAMINE DIMESYLATE 20 MG PO CAPS: 20.0000 mg | ORAL_CAPSULE | Freq: Every day | ORAL | 0 refills | Status: AC

## 2024-08-17 NOTE — Assessment & Plan Note (Signed)
 Acute viral infection with sore throat, headache, chills, and cough. Negative for influenza, streptococcal infection, COVID-19. - Use ibuprofen and Aleve for body aches. - Use honey for cough relief. - Prescribed Tessalon  Perles for cough suppression, up to three times a day as needed. - Use saline nasal spray up to three times a day. - Use steroid nasal spray in the evening. - Monitor symptoms and report if symptoms worsen or fever returns.

## 2024-08-17 NOTE — Progress Notes (Signed)
 "   Procedures performed today:    None.  Independent interpretation of notes and tests performed by another provider:   None.  Brief History, Exam, Impression, and Recommendations:    BP (!) 119/59 (BP Location: Left Arm, Patient Position: Sitting, Cuff Size: Normal)   Pulse 66   Temp 98 F (36.7 C) (Oral)   Resp 20   Ht 5' 1 (1.549 m)   Wt 179 lb (81.2 kg)   SpO2 100%   BMI 33.82 kg/m   Discussed the use of AI scribe software for clinical note transcription with the patient, who gave verbal consent to proceed.  History of Present Illness Robin Burke is a 44 year old female who presents with sore throat, cough, and chills.  Symptoms began on Sunday evening with a sore throat, which worsened by Monday morning. She experienced a severe episode of waking up choking on phlegm last night, which she described as 'not fun'.  She has a sore throat, headache, chills, and body aches. She felt warm but did not confirm a fever due to lack of a thermometer. She also experienced ear pressure and plugged ears.  She has been using Aleve D and ibuprofen for symptom relief. Aleve D helped reduce fatigue, allowing her to walk her dog, but symptoms worsened overnight.  She has a cough triggered by an itchy throat and postnasal drip, leading to choking on phlegm at night. She feels woozy after swallowing the phlegm.  She has not been around anyone known to be sick and has not received a flu shot this year but did get a COVID booster in the fall.  Flu and strep tests were negative, and she is awaiting COVID test results.  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  On exam, patient is in no acute distress, vital signs stable.  She presents wearing mask today.  Cardiovascular exam with regular rate and rhythm, lungs clear to auscultation bilaterally.  External auditory canals are clear, tympanic membranes with mild serous fluid behind TM, no significant bulging  or erythema noted.  Mild pharyngeal erythema, no significant cervical lymphadenopathy.  Viral upper respiratory tract infection Assessment & Plan: Acute viral infection with sore throat, headache, chills, and cough. Negative for influenza, streptococcal infection, COVID-19. - Use ibuprofen and Aleve for body aches. - Use honey for cough relief. - Prescribed Tessalon  Perles for cough suppression, up to three times a day as needed. - Use saline nasal spray up to three times a day. - Use steroid nasal spray in the evening. - Monitor symptoms and report if symptoms worsen or fever returns.  Orders: -     POC Covid19/Flu A&B Antigen  Sore throat -     POCT rapid strep A  Attention deficit hyperactivity disorder (ADHD), unspecified ADHD type Assessment & Plan: Continues with Vyvanse  10 mg, thinks that symptoms have not been as well controlled. Denies any issues with medication, no reported issues with chest pain, sleep issues. Reviewed PDMP, no red flags today. Provided new prescription of Vyvanse   Orders: -     Lisdexamfetamine  Dimesylate; Take 1 capsule (20 mg total) by mouth daily.  Dispense: 30 capsule; Refill: 0  Other orders -     Benzonatate ; Take 1 capsule (200 mg total) by mouth 3 (three) times daily as needed for cough.  Dispense: 45 capsule; Refill: 0 -     Fluticasone  Propionate; Place 2 sprays into both nostrils daily.  Dispense: 16 g; Refill: 1  Return in about 3 months (around 11/15/2024) for med check.   ___________________________________________ Loren Vicens de [place], MD, ABFM, [facility] "

## 2024-08-17 NOTE — Assessment & Plan Note (Signed)
 Continues with Vyvanse  10 mg, thinks that symptoms have not been as well controlled. Denies any issues with medication, no reported issues with chest pain, sleep issues. Reviewed PDMP, no red flags today. Provided new prescription of Vyvanse 

## 2024-09-08 ENCOUNTER — Other Ambulatory Visit (HOSPITAL_BASED_OUTPATIENT_CLINIC_OR_DEPARTMENT_OTHER): Payer: Self-pay | Admitting: Family Medicine

## 2025-05-17 ENCOUNTER — Ambulatory Visit (HOSPITAL_BASED_OUTPATIENT_CLINIC_OR_DEPARTMENT_OTHER): Admitting: Obstetrics & Gynecology
